# Patient Record
Sex: Female | Born: 1960 | Race: Asian | Hispanic: No | Marital: Married | Smoking: Never smoker
Health system: Southern US, Community
[De-identification: ages and names within clinical notes are randomized; demographics above are authoritative.]

## PROBLEM LIST (undated history)

## (undated) DIAGNOSIS — R112 Nausea with vomiting, unspecified: Secondary | ICD-10-CM

## (undated) DIAGNOSIS — E785 Hyperlipidemia, unspecified: Secondary | ICD-10-CM

## (undated) DIAGNOSIS — Z9889 Other specified postprocedural states: Secondary | ICD-10-CM

## (undated) HISTORY — DX: Hyperlipidemia, unspecified: E78.5

## (undated) HISTORY — PX: OTHER SURGICAL HISTORY: SHX169

---

## 1986-02-17 HISTORY — PX: APPENDECTOMY: SHX54

## 2020-05-30 ENCOUNTER — Other Ambulatory Visit (HOSPITAL_COMMUNITY)
Admission: RE | Admit: 2020-05-30 | Discharge: 2020-05-30 | Disposition: A | Discharge status: Home / Self Care | Payer: 59 | Source: Ambulatory Visit | Attending: Advanced Practice Midwife | Admitting: Advanced Practice Midwife

## 2020-05-30 ENCOUNTER — Ambulatory Visit (INDEPENDENT_AMBULATORY_CARE_PROVIDER_SITE_OTHER): Payer: 59 | Admitting: Advanced Practice Midwife

## 2020-05-30 ENCOUNTER — Other Ambulatory Visit: Payer: Self-pay

## 2020-05-30 ENCOUNTER — Encounter: Payer: Self-pay | Admitting: Advanced Practice Midwife

## 2020-05-30 VITALS — BP 120/72 | Ht 60.0 in | Wt 142.6 lb

## 2020-05-30 DIAGNOSIS — N950 Postmenopausal bleeding: Secondary | ICD-10-CM

## 2020-05-30 DIAGNOSIS — Z124 Encounter for screening for malignant neoplasm of cervix: Secondary | ICD-10-CM

## 2020-05-30 DIAGNOSIS — T839XXA Unspecified complication of genitourinary prosthetic device, implant and graft, initial encounter: Secondary | ICD-10-CM

## 2020-05-30 NOTE — Progress Notes (Signed)
Pt states she has IUD that has been in for years and she has not been able to have it removed. Because she states it could be stuck in there and cant get it out.

## 2020-06-01 LAB — CYTOLOGY - PAP
Comment: NEGATIVE
Diagnosis: UNDETERMINED — AB
High risk HPV: NEGATIVE

## 2020-06-05 NOTE — Progress Notes (Signed)
Patient ID: Dynesha Roth, female   DOB: [birth year], 60 y.o.   MRN: [id number]  Reason for Consult: Post-menopausal bleeding    Subjective:  HPI:  Lorraine Roth is a 60 y.o. female being seen for post-menopausal bleeding. She is accompanied by her daughter and the visit is facilitated by an interpreter. She has been post-menopausal since age 26 and last week she had 8 days of dark red/light flow bleeding. Last PAP smear was 4 or 5 years ago. She mentions having a Figure 8 IUD placed 30 years ago in [place]. She has been in the [place] for 1 year. She had imaging- unknown when or where- that showed the IUD had broken into pieces and would need to be surgically removed. She declined at the time. Recommended PAP smear today and follow up gyn ultrasound/MD appointment.  Update to visit: PAP smear is ASCUS/ negative HR HPV. Voicemail left on daughter's phone. Please see lab result note.  History reviewed. No pertinent past medical history. History reviewed. No pertinent family history. Past Surgical History:  Procedure Laterality Date  . APPENDECTOMY  1988    Short Social History:  Social History   Tobacco Use  . Smoking status: Never Smoker  . Smokeless tobacco: Never Used  Substance Use Topics  . Alcohol use: Never    No Known Allergies  No current outpatient medications on file.   No current facility-administered medications for this visit.   Review of Systems  Constitutional: Negative for chills and fever.  HENT: Negative for congestion, ear discharge, ear pain, hearing loss, sinus pain and sore throat.   Eyes: Negative for blurred vision and double vision.  Respiratory: Negative for cough, shortness of breath and wheezing.   Cardiovascular: Negative for chest pain, palpitations and leg swelling.  Gastrointestinal: Negative for abdominal pain, blood in stool, constipation, diarrhea, heartburn, melena, nausea and vomiting.  Genitourinary: Negative for dysuria, flank pain, frequency,  hematuria and urgency.       Positive for vaginal bleeding, IUD that has been in uterus for 30 years  Musculoskeletal: Negative for back pain, joint pain and myalgias.  Skin: Negative for itching and rash.  Neurological: Negative for dizziness, tingling, tremors, sensory change, speech change, focal weakness, seizures, loss of consciousness, weakness and headaches.  Endo/Heme/Allergies: Negative for environmental allergies. Does not bruise/bleed easily.  Psychiatric/Behavioral: Negative for depression, hallucinations, memory loss, substance abuse and suicidal ideas. The patient is not nervous/anxious and does not have insomnia.      Objective:  Objective   Vitals:   05/30/20 0825  BP: 120/72  Weight: 142 lb 9.6 oz (64.7 kg)  Height: 5' (1.524 m)   Body mass index is 27.85 kg/m. Constitutional: Well nourished, well developed female in no acute distress.  HEENT: normal Skin: Warm and dry.  Cardiovascular: Regular rate and rhythm.   Extremity: no edema  Respiratory: Clear to auscultation bilateral. Normal respiratory effort Abdomen: soft, nontender, nondistended, no abnormal masses, no epigastric pain Neuro: DTRs 2+, Cranial nerves grossly intact Psych: Alert and Oriented x3. No memory deficits. Normal mood and affect.  MS: normal gait, normal bilateral lower extremity ROM/strength/stability.  Pelvic exam: (female chaperone present) is not limited by body habitus EGBUS: within normal limits Vagina: within normal limits and with normal mucosa, no blood in the vault, no IUD strings visualized Cervix: normal appearance   Assessment/Plan:     60 y.o. G2 P85 female with post-menopausal bleeding, need for gyn ultrasound to evaluate 60 year old IUD  PAP smear  and follow up as needed Return for Gyn ultrasound Follow up visit with MD   Tresea Mall CNM [facility] 06/05/2020, 10:49

## 2020-07-24 ENCOUNTER — Ambulatory Visit
Admission: RE | Admit: 2020-07-24 | Discharge: 2020-07-24 | Disposition: A | Discharge status: Home / Self Care | Payer: 59 | Source: Ambulatory Visit | Attending: Advanced Practice Midwife | Admitting: Advanced Practice Midwife

## 2020-07-24 DIAGNOSIS — N950 Postmenopausal bleeding: Secondary | ICD-10-CM | POA: Diagnosis present

## 2020-07-24 DIAGNOSIS — T839XXA Unspecified complication of genitourinary prosthetic device, implant and graft, initial encounter: Secondary | ICD-10-CM | POA: Diagnosis present

## 2020-08-08 ENCOUNTER — Ambulatory Visit (INDEPENDENT_AMBULATORY_CARE_PROVIDER_SITE_OTHER): Payer: 59 | Admitting: Obstetrics and Gynecology

## 2020-08-08 ENCOUNTER — Ambulatory Visit: Payer: 59 | Admitting: Advanced Practice Midwife

## 2020-08-08 ENCOUNTER — Encounter: Payer: Self-pay | Admitting: Obstetrics and Gynecology

## 2020-08-08 VITALS — BP 110/70 | Ht 60.0 in | Wt 144.4 lb

## 2020-08-08 DIAGNOSIS — Z1231 Encounter for screening mammogram for malignant neoplasm of breast: Secondary | ICD-10-CM

## 2020-08-08 DIAGNOSIS — N950 Postmenopausal bleeding: Secondary | ICD-10-CM | POA: Diagnosis not present

## 2020-08-08 DIAGNOSIS — T8339XA Other mechanical complication of intrauterine contraceptive device, initial encounter: Secondary | ICD-10-CM | POA: Diagnosis not present

## 2020-08-08 NOTE — Progress Notes (Signed)
Patient ID: Lorraine Roth, female   DOB: [birth year], 60 y.o.   MRN: [id number]  Reason for Consult: Results   Referred by No ref. provider found  Subjective:     HPI:  [place] interpreter was used via the interpreter line for this encounter.  Lorraine Roth is a 61 y.o. female.  She is following up after consultation with Tresea Mall our nurse midwife.  Pelvic ultrasound showed a thickened endometrium as well as a retained IUD with in fragments.  Patient reports that her IUD was inserted in [place] 30 years ago and it is a figure-of-eight shape.  The patient is present at today's visit with her daughter and her daughter reports that her mother's vaginal bleeding has resolved.  Gynecological History  No LMP recorded. Patient is postmenopausal.  Last Pap: Results were: 2022 ASCUS HPV negative.   History reviewed. No pertinent past medical history. History reviewed. No pertinent family history. Past Surgical History:  Procedure Laterality Date   APPENDECTOMY  41    Short Social History:  Social History   Tobacco Use   Smoking status: Never   Smokeless tobacco: Never  Substance Use Topics   Alcohol use: Never    No Known Allergies  No current outpatient medications on file.   No current facility-administered medications for this visit.    Review of Systems  Constitutional: Negative for chills, fatigue, fever and unexpected weight change.  HENT: Negative for trouble swallowing.  Eyes: Negative for loss of vision.  Respiratory: Negative for cough, shortness of breath and wheezing.  Cardiovascular: Negative for chest pain, leg swelling, palpitations and syncope.  GI: Negative for abdominal pain, blood in stool, diarrhea, nausea and vomiting.  GU: Negative for difficulty urinating, dysuria, frequency and hematuria.  Musculoskeletal: Negative for back pain, leg pain and joint pain.  Skin: Negative for rash.  Neurological: Negative for dizziness, headaches,  light-headedness, numbness and seizures.  Psychiatric: Negative for behavioral problem, confusion, depressed mood and sleep disturbance.       Objective:  Objective   Vitals:   08/08/20 0851  BP: 110/70  Weight: 144 lb 6.4 oz (65.5 kg)  Height: 5' (1.524 m)   Body mass index is 28.2 kg/m.  Physical Exam Vitals and nursing note reviewed. Exam conducted with a chaperone present.  Constitutional:      Appearance: Normal appearance.  HENT:     Head: Normocephalic and atraumatic.  Eyes:     Extraocular Movements: Extraocular movements intact.     Pupils: Pupils are equal, round, and reactive to light.  Cardiovascular:     Rate and Rhythm: Normal rate and regular rhythm.  Pulmonary:     Effort: Pulmonary effort is normal.     Breath sounds: Normal breath sounds.  Abdominal:     General: Abdomen is flat.     Palpations: Abdomen is soft.  Musculoskeletal:     Cervical back: Normal range of motion.  Skin:    General: Skin is warm and dry.  Neurological:     General: No focal deficit present.     Mental Status: She is alert and oriented to person, place, and time.  Psychiatric:        Behavior: Behavior normal.        Thought Content: Thought content normal.        Judgment: Judgment normal.    Assessment/Plan:     60 year old with retained IUD and postmenopausal bleeding. We will plan hysteroscopy D&C for removal of IUD/fragments of IUD and  to sample the endometrium.  We discussed the risks of a hysteroscopy D&C including risk of infection risk of bleeding and risk of damage to surrounding pelvic organs.  All questions were answered. Note sent to surgical scheduler to schedule the procedure.  Breast cancer screening-mammogram ordered today discussed with patient.  More than 30 minutes were spent face to face with the patient in the room, reviewing the medical record, labs and images, and coordinating care for the patient. The plan of management was discussed in detail  and counseling was provided.   Adelene Idler MD [facility], [facility] 08/08/2020 9:54 AM

## 2020-08-08 NOTE — H&P (View-Only) (Signed)
 Patient ID: Lorraine Roth, female   DOB: 04/06/1960, 60 y.o.   MRN: [id number]  Reason for Consult: Results   Referred by No ref. provider found  Subjective:     HPI:  [place] interpreter was used via the interpreter line for this encounter.  Lorraine Roth is a 60 y.o. female.  She is following up after consultation with Jane Gledhill our nurse midwife.  Pelvic ultrasound showed a thickened endometrium as well as a retained IUD with in fragments.  Patient reports that her IUD was inserted in [place] 30 years ago and it is a figure-of-eight shape.  The patient is present at today's visit with her daughter and her daughter reports that her mother's vaginal bleeding has resolved.  Gynecological History  No LMP recorded. Patient is postmenopausal.  Last Pap: Results were: 2022 ASCUS HPV negative.   History reviewed. No pertinent past medical history. History reviewed. No pertinent family history. Past Surgical History:  Procedure Laterality Date   APPENDECTOMY  1988    Short Social History:  Social History   Tobacco Use   Smoking status: Never   Smokeless tobacco: Never  Substance Use Topics   Alcohol use: Never    No Known Allergies  No current outpatient medications on file.   No current facility-administered medications for this visit.    Review of Systems  Constitutional: Negative for chills, fatigue, fever and unexpected weight change.  HENT: Negative for trouble swallowing.  Eyes: Negative for loss of vision.  Respiratory: Negative for cough, shortness of breath and wheezing.  Cardiovascular: Negative for chest pain, leg swelling, palpitations and syncope.  GI: Negative for abdominal pain, blood in stool, diarrhea, nausea and vomiting.  GU: Negative for difficulty urinating, dysuria, frequency and hematuria.  Musculoskeletal: Negative for back pain, leg pain and joint pain.  Skin: Negative for rash.  Neurological: Negative for dizziness, headaches,  light-headedness, numbness and seizures.  Psychiatric: Negative for behavioral problem, confusion, depressed mood and sleep disturbance.       Objective:  Objective   Vitals:   08/08/20 0851  BP: 110/70  Weight: 144 lb 6.4 oz (65.5 kg)  Height: 5' (1.524 m)   Body mass index is 28.2 kg/m.  Physical Exam Vitals and nursing note reviewed. Exam conducted with a chaperone present.  Constitutional:      Appearance: Normal appearance.  HENT:     Head: Normocephalic and atraumatic.  Eyes:     Extraocular Movements: Extraocular movements intact.     Pupils: Pupils are equal, round, and reactive to light.  Cardiovascular:     Rate and Rhythm: Normal rate and regular rhythm.  Pulmonary:     Effort: Pulmonary effort is normal.     Breath sounds: Normal breath sounds.  Abdominal:     General: Abdomen is flat.     Palpations: Abdomen is soft.  Musculoskeletal:     Cervical back: Normal range of motion.  Skin:    General: Skin is warm and dry.  Neurological:     General: No focal deficit present.     Mental Status: She is alert and oriented to person, place, and time.  Psychiatric:        Behavior: Behavior normal.        Thought Content: Thought content normal.        Judgment: Judgment normal.    Assessment/Plan:     60-year-old with retained IUD and postmenopausal bleeding. We will plan hysteroscopy D&C for removal of IUD/fragments of IUD and   to sample the endometrium.  We discussed the risks of a hysteroscopy D&C including risk of infection risk of bleeding and risk of damage to surrounding pelvic organs.  All questions were answered. Note sent to surgical scheduler to schedule the procedure.  Breast cancer screening-mammogram ordered today discussed with patient.  More than 30 minutes were spent face to face with the patient in the room, reviewing the medical record, labs and images, and coordinating care for the patient. The plan of management was discussed in detail  and counseling was provided.   Adelene Idler MD [facility], [facility] 08/08/2020 9:54 AM

## 2020-08-08 NOTE — Patient Instructions (Signed)
Soi t? cung Hysteroscopy Soi t? cung l th? thu?t ???c s? d?ng ?? nhn vo bn trong d? con (t? cung) c?a ph? n?. Th? thu?t ny c th? ???c th?c hi?n v nhi?u l, bao g?m: ?? tm kh?i u v cc kh?i u khc trong t? cung. ?? ?nh gi ch?y mu b?t th??ng, kh?i u x?, polyp, m s?o ho?c ung th? t? cung. ?? xc ??nh nguyn nhn ph? n? khng th? mang thai ho?c b? s?y thai nhi?u l?n. ?? ??nh v? IUD (vng trnh [place] trong t? cung). ?? ??t d?ng c? trnh [place] vo ?ng d?n tr?ng. Trong qu trnh th?c hi?n th? thu?t ny, m?t ?ng m?nh, m?m c ?n nh? v camera (?ng soi t? cung) ???c s? d?ng ?? ki?m tra t? cung. Camera g?i hnh ?nh ??n mn hnh trong phng ?? chuyn gia ch?m [place] s?c kh?e c th? xem bn trong t? cung c?a qu v?.Ph?i th?c hi?n soi t? cung ngay sau k? kinh. Hy cho chuyn gia ch?m [place] s?c kh?e bi?t v?: B?t k? d? ?ng no m qu v? c. T?t c? cc lo?i thu?c m qu v? ?ang s? d?ng, bao g?m c? vitamin, th?o d??c, thu?c nh? m?t, thu?c d?ng kem v thu?c khng k ??n. B?t k? v?n ?? no m qu v? ho?c cc thnh vin trong gia ?nh ? b? v?i thu?c gy m. B?t k? b?nh l v? mu no m qu v? c. B?t k? ph?u thu?t no qu v? ? c. B?t k? tnh tr?ng b?nh l no qu v? c. Qu v? c ?ang mang thai ho?c c th? c thai hay khng. D qu v? ? t?ng c ch?n ?on l b? STI (b?nh ly truy?n qua ???ng tnh d?c) ho?c qu v? ngh? r?ng qu v? b? STI. C nh?ng nguy c? no? Nhn chung, ?y l m?t th? thu?t an ton. Tuy nhin, cc v?n ?? c th? x?y ra, bao g?m: Ch?y mu qu nhi?u. Nhi?m trng. T?n th??ng t? cung ho?c cc c?u trc ho?c c? quan khc. Ph?n ?ng d? ?ng v?i thu?c ho?c d?ch ???c s? d?ng trong th? thu?t. ?i?u g x?y ra tr??c khi lm th? thu?t? Hy ?? c? th? ?? n??c Tun th? ch? d?n c?a chuyn gia ch?m [place] s?c kh?e v? vi?c duy tr ?? n??c, c th? bao g?m: T?i ?a 2 ti?ng tr??c khi lm th? thu?t - qu v? c th? ti?p t?c u?ng ?? l?ng trong, ch?ng h?n nh? n??c, n??c p tri cy trong, c ph ?en v tr nguyn  ch?t. Nh?ng h?n ch? v? ?n v u?ng [place] th? ch? d?n c?a chuyn gia ch?m [place] s?c kh?e v? ?n v u?ng, c th? bao g?m: 8 gi? tr??c khi ti?n hnh th? thu?t - d?ng ?n th?c ph?m r?n v ch? u?ng ?? l?ng trong. 2 gi? tr??c khi ti?n hnh th? thu?t - d?ng u?ng ?? l?ng trong. Thu?c Hy h?i chuyn gia ch?m [place] s?c kh?e v? vi?c: Thay ??i ho?c d?ng s? d?ng cc lo?i thu?c th??ng xuyn dng c?a qu v?. ?i?u ny ??c bi?t quan tr?ng n?u qu v? ?ang dng thu?c ?i?u tr? ti?u ???ng ho?c thu?c lm long mu. ?ang dng cc lo?i thu?c nh? aspirin v ibuprofen. Nh?ng lo?i thu?c ny c th? lm long mu. Khng du?ng nh??ng loa?i thu?c ny tr? khi chuyn gia ch?m so?c s??c kho?e h??ng d?n qu v? dng. Dng cc thu?c khng k ??n, cc vitamin, th?o d??c v th?c ph?m ch?c n?ng. Thu?c c th? ???c ??  t vo c? t? cung c?a qu v? vo ngy tr??c khi lm th? thu?t. Thu?c ny lm cho c? t? cung m? ra (gin ra). L? m? l?n h?n gip ??a ?ng soi t? cung vo trong t? cung d? dng h?n trong qu trnh lm th? thu?t. H??ng d?n chung Hy h?i chuyn gia ch?m [place] s?c kh?e: S? th?c hi?n nh?ng b??c g ?? gip ng?n ng?a nhi?m trng. Nh?ng b??c ny c th? bao g?m: R?a da b?ng x bng di?t m?m b?nh. Dng thu?c khng sinh. Khng s? d?ng b?t k? s?n ph?m no c nicotine ho?c thu?c l trong t nh?t 4 tu?n tr??c khi lm th? thu?t. Nh?ng s?n ph?m ny bao g?m thu?c l d?ng ht, thu?c l d?ng nhai v d?ng c? ht thu?c, ch?ng h?n nh? thu?c l ?i?n t?. N?u qu v? c?n gip ?? ?? cai thu?c, hy h?i chuyn gia ch?m [place] s?c kh?e. C k? ho?ch nh? m?t ng??i l?n ch?u trch nhi?m ??a quy? vi? t? b?nh vi?n ho?c t? phng khm v? nh. C k? ho?ch nh? m?t ng??i l?n ch?u trch nhi?m ch?m [place] cho qu v? trong kho?ng th?i gian qu v? ???c ch? d?n sau khi qu v? r?i b?nh vi?n ho?c pho?ng kha?m. ?i?u ny c vai tr quan tr?ng. ?i ti?u tr??c khi b?t ??u lm th? thu?t. ?i?u g x?y ra trong qu trnh th?c hi?n th? thu?t? M?t ???ng truy?n t?nh m?ch (IV) s? ???c lu?n vo m?t  trong cc t?nh m?ch c?a qu v?. Qu v? c th? ???c cho dng: M?t lo?i thu?c ?? gip quy? vi? th? gin (thu?c an th?n). M?t lo?i thu?c ?? lm t vng xung quanh c? t? cung (gy t c?c b?). M?t lo?i thu?c lm qu v? ng? (thu?c gy m theo ???ng toa?n thn). ?ng soi t? cung s? ???c lu?n qua m ??o v vo t? cung c?a qu v?. Khng kh ho?c ch?t l?ng s? ???c s? d?ng nh?m m? r?ng t? cung c?a qu v? ?? cho php chuyn gia ch?m [place] s?c kh?e quan st t? cung r h?n. L??ng ch?t l?ng ???c s? d?ng s? ???c ki?m tra k? l??ng trong su?t qu trnh th?c hi?n th? thu?t. Trong m?t s? tr??ng h?p, ng??i ta c th? c?o nh? m t? bn trong t? cung v g?i ??n phng th nghi?m ?? xt nghi?m (sinh Braeleigh?t). Th? thu?t ny c th? khc nhau gi?a cc chuyn gia ch?m [place] s?c kh?e v ccb?nh vi?n. Ti c th? d? ki?n ?i?u g s? x?y ra sau khi lm th? thu?t? Huy?t p, nh?p tim, nh?p th? v n?ng ??  xi trong mu c?a qu v? s? ???c theo di cho ??n khi qu v? xu?t vi?n ho?c r?i phng khm. Qu v? c th? b? ?au qu?n. Qu v? c th? ???c cho dng thu?c ?i?u tr? v?n ?? ny. Qu v? c th? b? ch?y mu, t? ??m nh?t ??n ch?y mu nh? kinh nguy?t. Hi?n t??ng ny l bnh th??ng. N?u ???c sinh Dariana?t, qu v? ???c ty  l?y k?t qu?Marland Kitchen Hy h?i chuyn gia ch?m [place] s?c kh?e ho?c khoa th?c hi?n th? thu?t khi no c k?t qu? c?a qu v?. Tun th? nh?ng h??ng d?n ny ? nh: Ho?t ??ng Ngh? ng?i theo ch? d?n c?a chuyn gia ch?m [place] s?c kh?e. Tr? l?i sinh ho?t bnh th??ng theo ch? d?n c?a chuyn gia ch?m [place] s?c kh?e. Hy h?i chuyn gia ch?m  s?c kh?e v? cc ho?t ??ng no l an ton cho  qu v?. N?u qu v? ???c cho dng thu?c an th?n trong qu trnh th?c hi?n th? thu?t, thu?c c th? ?nh h??ng ??n qu v? trong vi gi?Imagene Sheller li xe ho?c v?n hnh my mc cho ??n khi chuyn gia ch?m [place] s?c kh?e c?a quy? vi? no?i vi?c ?o? la? an toa?n. Thu?c Khng dng aspirin ho?c cc thu?c ch?ng vim khng steroid (NSAID) khc trong khi h?i ph?c, theo ch? d?n c?a chuyn gia  ch?m [place] s?c kh?e. Thu?c c th? lm t?ng nguy c? ch?y mu. Hy h?i chuyn gia ch?m [place] s?c kh?e xem khi dng thu?c ???c k ??n cho qu v?: C c?n ph?i trnh li xe ho?c trnh s? d?ng my mc hay khng. C th? gy to bn hay khng. Qu v? c th? c?n th?c hi?n cc hnh ??ng ny ?? ng?n ng?a ho?c ?i?u tr? to bn: U?ng ?? n??c ?? gi? cho n??c ti?u c mu vng nh?t. Dng thu?c khng k ??n ho?c thu?c k ??n. ?n th?c ?n giu ch?t x? nh? ??u, ng? c?c nguyn cm, tri cy t??i v rau. H?n ch? cc lo?i th?c ?n giu ch?t bo v ???ng tinh luy?n, ch?ng h?n nh? ?? ?n chin/rn ho?c ?? ng?t. H??ng d?n chung Khng th?t r?a, s?? du?ng b?ng v? sinh d?ng nt, ho?c quan h? tnh d?c trong 2 tu?n sau khi lm th? thu?t, ho?c cho ??n khi chuyn gia ch?m so?c s??c kho?e ch?p thu?n. Khng t?m b?n, b?i, ho?c s? d?ng b?n t?m n??c nng cho ??n khi ???c chuyn gia ch?m [place] s?c kh?e c?a qu v? ch?p thu?n. T?m vi hoa sen thay v t?m b?n trong 2 tu?n, ho?c trong kho?ng th?i gian theo ch? d?n c?a chuyn gia ch?m [place] s?c kh?e. Tun th? theo t?t c? cc l?n khm l?i. ?i?u ny c vai tr quan tr?ng. Hy lin l?c v?i chuyn gia ch?m [place] s?c kh?e n?u: Qu v? c?m th?y chng m?t ho?c chong vng. Qu v? c?m th?y bu?n nn. Qu v? ra kh h? ? m ??o b?t th??ng. Qu v? b? pht ban. Quy? vi? bi? ?au khng ??? sau khi du?ng thu?c. Qu v? b? ?n l?nh. Yu c?u tr? gip ngay l?p t?c n?u: Qu v? b? ch?y mu n?ng h?n k? kinh thng th??ng. Qu v? b? s?t. Qu v? b? ?au ho?c ?au qu?n tr?m tr?ng h?n. Qu v? b? c?n ?au b?ng m?i. Qu v? b? ng?t. Qu v? b? ?au ? vai. Quy? vi? bi? kh th?. Tm t?t Soi t? cung l th? thu?t ???c s? d?ng ?? nhn vo bn trong d? con (t? cung) c?a ph? n?. Sau khi lm th? thu?t, qu v? c th? b? ch?y mu, t? ??m nh?t ??n ch?y mu gi?ng nh? kinh nguy?t. Hi?n t??ng ny l bnh th??ng. Qu v? c?ng c th? b? ?au qu?n. Khng th?t r?a, s?? du?ng b?ng v? sinh d?ng nt, ho?c quan h? tnh d?c trong 2 tu?n sau khi lm  th? thu?t, ho?c cho ??n khi chuyn gia ch?m so?c s??c kho?e ch?p thu?n. C k? ho?ch nh? m?t ng??i l?n ch?u trch nhi?m ??a quy? vi? t? b?nh vi?n ho?c t? phng khm v? nh. Thng tin ny khng nh?m m?c ?ch thay th? cho l?i khuyn m chuyn gia ch?m [place] s?c kh?e ni v?i qu v?. Hy b?o ??m qu v? ph?i th?o lu?n b?t k? v?n ?? gm qu v? c v?i chuyn gia ch?m [place] s?c kh?e c?a qu v?. Document Revised: 11/25/2019 Document Reviewed: 11/25/2019 Elsevier  Elsevier Patient Education  2022 Elsevier Inc.  

## 2020-08-13 ENCOUNTER — Telehealth: Payer: Self-pay

## 2020-08-13 NOTE — Telephone Encounter (Signed)
-----   Message from Christanna R Schuman, MD sent at 08/08/2020  9:50 AM EDT ----- Regarding: surgery Surgery Booking Request Patient Full Name:  Kamaria Edman  MRN: [id number]  DOB: 10/29/1960  Surgeon: Christanna R Schuman, MD  Requested Surgery Date and Time: next 1-2 weeks Primary Diagnosis AND Code: Postmenopausal bleeding, retained IUD Secondary Diagnosis and Code:  Surgical Procedure: Hysteroscopy D&C, IUD removal RNFA Requested?: No L&D Notification: No Admission Status: same day surgery Length of Surgery: 50 min Special Case Needs: No H&P: No Phone Interview???:  Yes Interpreter: Yes Medical Clearance:  No Special Scheduling Instructions: No Any known health/anesthesia issues, diabetes, sleep apnea, latex allergy, defibrillator/pacemaker?: No Acuity: P2   (P1 highest, P2 delay may cause harm, P3 low, elective gyn, P4 lowest)   

## 2020-08-14 ENCOUNTER — Telehealth: Payer: Self-pay

## 2020-08-14 ENCOUNTER — Other Ambulatory Visit: Payer: Self-pay

## 2020-08-14 ENCOUNTER — Ambulatory Visit
Admission: RE | Admit: 2020-08-14 | Discharge: 2020-08-14 | Disposition: A | Discharge status: Home / Self Care | Payer: 59 | Source: Ambulatory Visit | Attending: Obstetrics and Gynecology | Admitting: Obstetrics and Gynecology

## 2020-08-14 DIAGNOSIS — Z1231 Encounter for screening mammogram for malignant neoplasm of breast: Secondary | ICD-10-CM | POA: Diagnosis present

## 2020-08-14 NOTE — Telephone Encounter (Signed)
Called patient's daughter (on [place]) to schedule Hysteroscopy D&C, IUD removal w Schuman  DOS 7/7  H&P  n/a   Pre-admit phone call appointment to be requested. All appointments will be updated on pt MyChart. Explained that this appointment has a call window. Based on the time scheduled will indicate if the call will be received within a 4 hour window before 1:00 or after.  Advised that pt may also receive calls from the hospital pharmacy and pre-service center.  Confirmed pt has [employer] as primary insurance. No secondary insurance.

## 2020-08-14 NOTE — Telephone Encounter (Signed)
-----   Message from Natale Milch, MD sent at 08/08/2020  9:50 AM EDT ----- Regarding: surgery Surgery Booking Request Patient Full Name:  Lorraine Roth  MRN: [id number]  DOB: 11/25/1960  Surgeon: Natale Milch, MD  Requested Surgery Date and Time: next 1-2 weeks Primary Diagnosis AND Code: Postmenopausal bleeding, retained IUD Secondary Diagnosis and Code:  Surgical Procedure: Hysteroscopy D&C, IUD removal RNFA Requested?: No L&D Notification: No Admission Status: same day surgery Length of Surgery: 50 min Special Case Needs: No H&P: No Phone Interview???:  Yes Interpreter: Yes Medical Clearance:  No Special Scheduling Instructions: No Any known health/anesthesia issues, diabetes, sleep apnea, latex allergy, defibrillator/pacemaker?: No Acuity: P2   (P1 highest, P2 delay may cause harm, P3 low, elective gyn, P4 lowest)

## 2020-08-16 ENCOUNTER — Other Ambulatory Visit
Admission: RE | Admit: 2020-08-16 | Discharge: 2020-08-16 | Disposition: A | Discharge status: Home / Self Care | Payer: 59 | Source: Ambulatory Visit | Attending: Obstetrics and Gynecology | Admitting: Obstetrics and Gynecology

## 2020-08-16 ENCOUNTER — Other Ambulatory Visit: Payer: Self-pay

## 2020-08-16 NOTE — Patient Instructions (Addendum)
Th? t?c c?a b?n ???c ln l?ch vo: 08/23/20 - Th? N?m Bo cho Qu?y ??ng k t?i t?ng 1 Trung tm Y t?. ?? bi?t th?i gian ??n c?a b?n, vui lng g?i [phone] [id number] t? 1PM - 3PM vo: 08/22/20 [facility] 08/21/20 lc 10:30 cho phng th nghi?m v ti.  NH?: Cc h??ng d?n khng ???c tun th? hon ton c th? d?n ??n r?i ro y t? nghim tr?ng, c th? d?n ??n t? vong; ho?c ty theo quy?t ??nh c?a bc s? ph?u thu?t v bc s? gy m, ph?u thu?t c?a b?n c th? c?n ???c ln l?ch l?i.  Khng ?n th?c ?n sau n?a ?m vo ?m tr??c khi ph?u thu?t. Khng nhai k?o cao su, k?o ng?m ho?c k?o c?ng.  Tuy nhin, b?n c th? u?ng ch?t l?ng CLEAR t?i ?a 2 gi? tr??c khi d? ki?n ??n ph?u thu?t. Khng u?ng b?t c? th? g trong vng 2 gi? k? t? th?i gian ??n d? ki?n c?a b?n.  Ch?t l?ng trong bao g?m: - n??c - n??c p to khng c b - gatorade (khng ph?i ??, TM, HO?C XANH) - c ph ?en ho?c tr (KHNG thm s?a ho?c kem t??i vo c ph ho?c tr) K[facility]NG u?ng b?t c? th? g khng c trong danh sch ny  B?nh nhn ti?u ???ng lo?i 1 v lo?i 2 ch? nn u?ng n??c.  Ngoi ra, bc s? ? ch? ??nh cho b?n u?ng lo?i thu?c ???c cung c?p ??m b?o Th?c u?ng c Carbohydrate r rng tr??c khi ph?u thu?t U?ng th?c u?ng ch?a carbohydrate ny tr??c khi ph?u thu?t hai gi? gip gi?m tnh tr?ng khng insulin v c?i [place]?n k?t qu? c?a b?nh nhn. Vui lng hon thnh vi?c u?ng tr??c 2 gi? so v?i th?i gian ??n d? ki?n.  HY DNG NH?NG THU?C [place] SAU KHI PH?U THU?T B?NG SIP N??C: KHNG  M?t tu?n tr??c khi ph?u thu?t: Ng?ng thu?c ch?ng vim (NSAIDS) nh? Advil, Aleve, Ibuprofen, Motrin, Naproxen, Naprosyn v cc s?n ph?m d?a trn Aspirin nh? Excedrin, Goodys Powder, BC Powder.  Ng?ng b?t ??u t? 30/06/22 B?T K? CH?T b? sung NO TRN ??M cho ??n sau khi ph?u thu?t.  B?n c th? dng Tylenol n?u c?n ?? gi?m ?au cho ??n ngy ph?u thu?t.  Khng u?ng r??u trong 24 gi? tr??c ho?c sau khi ph?u thu?t.  Khng ht thu?c bao g?m c? thu?c l  ?i?n t? trong 24 gi? tr??c khi ph?u thu?t. Khng nhai cc s?n ph?m thu?c l t nh?t 6 gi? tr??c khi ph?u thu?t. Khng c mi?ng dn nicotine vo ngy ph?u thu?t.  Khng s? d?ng b?t k? lo?i thu?c "gi?i tr" no t nh?t m?t tu?n tr??c khi ph?u thu?t. Xin l?u  r?ng s? k?t h?p gi?a cocaine v gy m c th? c k?t qu? tiu c?c, c th? d?n ??n t? vong. N?u b?n xt nghi?m d??ng tnh v?i cocaine, cu?c ph?u thu?t c?a b?n s? b? h?y b?Marland Kitchen Vo bu?i sng ph?u thu?t, ?nh r?ng b?ng kem ?nh r?ng v n??c, b?n c th? [place] mi?ng b?ng n??c [place] mi?ng n?u mu?n. Khng nu?t b?t k? lo?i kem ?nh r?ng ho?c n??c [place] mi?ng no.  Khng ?eo ?? trang s?c, ?? trang ?i?m, k?p tc, k?p ho?c s?n mng tay.  Khng thoa kem d??ng da, ph?n ph? ho?c n??c hoa.  Khng c?o c? th? t? c? tr? xu?ng 48 gi? tr??c khi ph?u thu?t ?? phng b?n t? c?t v c th? ?? l?i v?t th??ng nhi?m  trng. Ngoi ra, da m?i c?o c th? b? kch ?ng n?u s? d?ng x phng CHG.  Khng ???c ?eo knh p trng, my tr? thnh v r?ng gi? khi ph?u thu?t.  Khng mang ?? ??c c gi tr? ??n b?nh vi?n. [place] khng ch?u trch nhi?m cho b?t k? ?? ??c ho?c v?t c gi tr? no b? th?t l?c / m?t mt.  Thng bo cho bc s? c?a b?n n?u c b?t k? thay ??i no v? tnh tr?ng b?nh c?a b?n (c?m l?nh, s?t, nhi?m trng).  M?c qu?n o tho?i mi (dnh ring cho lo?i ph?u thu?t c?a b?n) ??n b?nh vi?n.  Sau khi ph?u thu?t, b?n c th? gip ng?n ng?a cc bi?n ch?ng ph?i b?ng cch th?c hi?n cc bi t?p th?. Ht th? su v ho sau m?i 1-2 gi?. Bc s? c?a b?n c th? yu c?u m?t [place]?t b? g?i l My ?o xo?n ?c khuy?n khch ?? gip b?n ht th? su. Khi ho ho?c h?t h?i, dng hai tay gi? ch?t m?t chi?c g?i d?a vo v?t m?. ?y ???c g?i l "n?p". Lm ?i?u ny gip b?o v? v?t m? c?a b?n. N c?ng lm gi?m s? kh ch?u ? b?ng.  IN?u b?n nh?p vi?n qua ?m, hy ?? vali trn xe. Sau khi ph?u thu?t, n c th? ???c mang ??n phng c?a b?n.  N?u b?n ???c xu?t vi?n vo ngy ph?u thu?t, b?n s? khng ???c  php li xe v? nh. B?n s? c?n m?t ng??i l?n c trch nhi?m (18 tu?i tr? ln) ch? b?n v? nh v ? l?i v?i b?n ?m ?Marland Kitchen  N?u b?n s? d?ng ph??ng ti?n giao thng cng c?ng, b?n s? c?n ph?i c ng??i l?n c trch nhi?m (18 tu?i tr? ln) ?i cng. Vui lng xc nh?n v?i bc s? c?a b?n r?ng b?n c th? s? d?ng ph??ng ti?n giao thng cng c?ng.  Vui lng g?i cho Phng Ki?m tra Tr??c khi nh?p h?c theo s? [phone] n?u b?n c b?t k? cu h?i no v? nh?ng h??ng d?n ny.  Chnh sch th?m khm ph?u thu?t:  B?nh nhn ?ang tr?i qua m?t cu?c ph?u thu?t ho?c th? thu?t c th? c m?t thnh vin trong gia ?nh ho?c ng??i h? tr? ?i cng v?i h? mi?n l ng??i ? khng d??ng tnh v?i COVID-19 ho?c ?ang g?p cc tri?u ch?ng c?a n. Ng??i ? c th? ? l?i khu v?c ch? ??i trong qu trnh lm th? t?c.  Th?m khm b?nh nhn n?i tr:  Gi? th?m quan l 7 gi? sng ??n 8 gi? t?i. B?nh nhn n?i tr s? ???c php hai ng??i th?m vi?ng m?i ngy. Nh?ng ng??i ??n th?m c th? thay ??i m?i ngy trong th?i gian b?nh nhn l?u tr. Khng c du khch d??i 12 tu?i. M?i du khch d??i 18 tu?i ph?i c ng??i l?n ?i cng. Ng??i ??n th?m ph?i v??t qua cc cu?c ki?m tra COVID-19, s? d?ng n??c r?a tay khi vo v ra kh?i phng c?a b?nh nhn v lun ?eo kh?u trang, k? c? khi ? trong phng c?a b?nh nhn. B?nh nhn c?ng ph?i ?eo kh?u trang khi nhn vin ho?c khch c?a h? ? trong phng. C?n ph?i ?eo kh?u trang b?t k? tnh tr?ng tim ch?ng.

## 2020-08-21 ENCOUNTER — Encounter: Payer: Self-pay | Admitting: Urgent Care

## 2020-08-21 ENCOUNTER — Other Ambulatory Visit
Admission: RE | Admit: 2020-08-21 | Discharge: 2020-08-21 | Disposition: A | Discharge status: Home / Self Care | Payer: 59 | Source: Ambulatory Visit | Attending: Obstetrics and Gynecology | Admitting: Obstetrics and Gynecology

## 2020-08-21 ENCOUNTER — Other Ambulatory Visit: Payer: Self-pay

## 2020-08-21 DIAGNOSIS — Z01812 Encounter for preprocedural laboratory examination: Secondary | ICD-10-CM | POA: Insufficient documentation

## 2020-08-21 LAB — CBC
HCT: 40.8 % (ref 36.0–46.0)
Hemoglobin: 13.3 g/dL (ref 12.0–15.0)
MCH: 28.8 pg (ref 26.0–34.0)
MCHC: 32.6 g/dL (ref 30.0–36.0)
MCV: 88.3 fL (ref 80.0–100.0)
Platelets: 227 10*3/uL (ref 150–400)
RBC: 4.62 MIL/uL (ref 3.87–5.11)
RDW: 11.8 % (ref 11.5–15.5)
WBC: 5.9 10*3/uL (ref 4.0–10.5)
nRBC: 0 % (ref 0.0–0.2)

## 2020-08-23 ENCOUNTER — Ambulatory Visit: Payer: 59 | Admitting: Anesthesiology

## 2020-08-23 ENCOUNTER — Encounter: Payer: Self-pay | Admitting: Obstetrics and Gynecology

## 2020-08-23 ENCOUNTER — Ambulatory Visit
Admission: RE | Admit: 2020-08-23 | Discharge: 2020-08-23 | Disposition: A | Discharge status: Home / Self Care | Payer: 59 | Attending: Obstetrics and Gynecology | Admitting: Obstetrics and Gynecology

## 2020-08-23 ENCOUNTER — Other Ambulatory Visit: Payer: Self-pay

## 2020-08-23 ENCOUNTER — Encounter
Admission: RE | Disposition: A | Discharge status: Home / Self Care | Payer: Self-pay | Source: Home / Self Care | Attending: Obstetrics and Gynecology

## 2020-08-23 DIAGNOSIS — N950 Postmenopausal bleeding: Secondary | ICD-10-CM | POA: Diagnosis not present

## 2020-08-23 DIAGNOSIS — N711 Chronic inflammatory disease of uterus: Secondary | ICD-10-CM | POA: Diagnosis not present

## 2020-08-23 DIAGNOSIS — Z0000 Encounter for general adult medical examination without abnormal findings: Secondary | ICD-10-CM

## 2020-08-23 DIAGNOSIS — Z30432 Encounter for removal of intrauterine contraceptive device: Secondary | ICD-10-CM | POA: Insufficient documentation

## 2020-08-23 DIAGNOSIS — T8339XA Other mechanical complication of intrauterine contraceptive device, initial encounter: Secondary | ICD-10-CM | POA: Diagnosis not present

## 2020-08-23 HISTORY — DX: Nausea with vomiting, unspecified: R11.2

## 2020-08-23 HISTORY — DX: Other specified postprocedural states: Z98.890

## 2020-08-23 HISTORY — PX: IUD REMOVAL: SHX5392

## 2020-08-23 HISTORY — PX: HYSTEROSCOPY WITH D & C: SHX1775

## 2020-08-23 LAB — POCT PREGNANCY, URINE: Preg Test, Ur: NEGATIVE

## 2020-08-23 LAB — ABO/RH: ABO/RH(D): A POS

## 2020-08-23 SURGERY — DILATATION AND CURETTAGE /HYSTEROSCOPY
Anesthesia: General

## 2020-08-23 MED ORDER — DEXAMETHASONE SODIUM PHOSPHATE 10 MG/ML IJ SOLN
INTRAMUSCULAR | Status: DC | PRN
  Administered 2020-08-23: 10 mg via INTRAVENOUS

## 2020-08-23 MED ORDER — FAMOTIDINE 20 MG PO TABS
ORAL_TABLET | ORAL | Status: AC
  Administered 2020-08-23: 20 mg via ORAL
  Filled 2020-08-23: qty 1

## 2020-08-23 MED ORDER — DEXAMETHASONE SODIUM PHOSPHATE 10 MG/ML IJ SOLN
INTRAMUSCULAR | Status: AC
  Filled 2020-08-23: qty 1

## 2020-08-23 MED ORDER — MIDAZOLAM HCL 2 MG/2ML IJ SOLN
INTRAMUSCULAR | Status: DC | PRN
  Administered 2020-08-23: 2 mg via INTRAVENOUS

## 2020-08-23 MED ORDER — ONDANSETRON HCL 4 MG/2ML IJ SOLN
INTRAMUSCULAR | Status: DC | PRN
  Administered 2020-08-23: 4 mg via INTRAVENOUS

## 2020-08-23 MED ORDER — 0.9 % SODIUM CHLORIDE (POUR BTL) OPTIME
TOPICAL | Status: DC | PRN
  Administered 2020-08-23: 500 mL

## 2020-08-23 MED ORDER — PROMETHAZINE HCL 25 MG/ML IJ SOLN: 6.2500 mg | INTRAMUSCULAR | Status: DC | PRN

## 2020-08-23 MED ORDER — PROPOFOL 10 MG/ML IV BOLUS
INTRAVENOUS | Status: AC
  Filled 2020-08-23: qty 20

## 2020-08-23 MED ORDER — LIDOCAINE HCL (CARDIAC) PF 100 MG/5ML IV SOSY
PREFILLED_SYRINGE | INTRAVENOUS | Status: DC | PRN
  Administered 2020-08-23: 80 mg via INTRAVENOUS

## 2020-08-23 MED ORDER — FENTANYL CITRATE (PF) 100 MCG/2ML IJ SOLN
INTRAMUSCULAR | Status: AC
  Filled 2020-08-23: qty 2

## 2020-08-23 MED ORDER — IBUPROFEN 600 MG PO TABS: 600.0000 mg | ORAL_TABLET | Freq: Four times a day (QID) | ORAL | 0 refills | Status: DC | PRN

## 2020-08-23 MED ORDER — PROPOFOL 10 MG/ML IV BOLUS
INTRAVENOUS | Status: DC | PRN
  Administered 2020-08-23: 30 mg via INTRAVENOUS
  Administered 2020-08-23: 120 mg via INTRAVENOUS
  Administered 2020-08-23: 20 mg via INTRAVENOUS

## 2020-08-23 MED ORDER — MIDAZOLAM HCL 2 MG/2ML IJ SOLN
INTRAMUSCULAR | Status: AC
  Filled 2020-08-23: qty 2

## 2020-08-23 MED ORDER — ONDANSETRON HCL 4 MG/2ML IJ SOLN
INTRAMUSCULAR | Status: AC
  Filled 2020-08-23: qty 2

## 2020-08-23 MED ORDER — APREPITANT 40 MG PO CAPS: 40.0000 mg | ORAL_CAPSULE | Freq: Once | ORAL | Status: AC

## 2020-08-23 MED ORDER — CHLORHEXIDINE GLUCONATE 0.12 % MT SOLN
OROMUCOSAL | Status: AC
  Administered 2020-08-23: 15 mL via OROMUCOSAL
  Filled 2020-08-23: qty 15

## 2020-08-23 MED ORDER — APREPITANT 40 MG PO CAPS
ORAL_CAPSULE | ORAL | Status: AC
  Administered 2020-08-23: 40 mg via ORAL
  Filled 2020-08-23: qty 1

## 2020-08-23 MED ORDER — CHLORHEXIDINE GLUCONATE 0.12 % MT SOLN: 15.0000 mL | Freq: Once | OROMUCOSAL | Status: AC

## 2020-08-23 MED ORDER — FAMOTIDINE 20 MG PO TABS: 20.0000 mg | ORAL_TABLET | Freq: Once | ORAL | Status: AC

## 2020-08-23 MED ORDER — FENTANYL CITRATE (PF) 100 MCG/2ML IJ SOLN
INTRAMUSCULAR | Status: DC | PRN
  Administered 2020-08-23 (×2): 25 ug via INTRAVENOUS

## 2020-08-23 MED ORDER — FENTANYL CITRATE (PF) 100 MCG/2ML IJ SOLN: 25.0000 ug | INTRAMUSCULAR | Status: DC | PRN

## 2020-08-23 MED ORDER — ORAL CARE MOUTH RINSE
15.0000 mL | Freq: Once | OROMUCOSAL | Status: AC
Start: 2020-08-23 — End: 2020-08-23

## 2020-08-23 MED ORDER — LACTATED RINGERS IV SOLN: INTRAVENOUS | Status: DC

## 2020-08-23 MED ORDER — POVIDONE-IODINE 10 % EX SWAB
2.0000 "application " | Freq: Once | CUTANEOUS | Status: AC
  Administered 2020-08-23: 2 via TOPICAL

## 2020-08-23 MED ORDER — SILVER NITRATE-POT NITRATE 75-25 % EX MISC
CUTANEOUS | Status: AC
  Filled 2020-08-23: qty 10

## 2020-08-23 SURGICAL SUPPLY — 24 items
BACTOSHIELD CHG 4% 4OZ (MISCELLANEOUS) ×1
CATH ROBINSON RED A/P 16FR (CATHETERS) ×2 IMPLANT
CNTNR SPEC 2.5X3XGRAD LEK (MISCELLANEOUS) ×1
CONT SPEC 4OZ STER OR WHT (MISCELLANEOUS) ×1
CONT SPEC 4OZ STRL OR WHT (MISCELLANEOUS) ×1
CONTAINER SPEC 2.5X3XGRAD LEK (MISCELLANEOUS) ×1 IMPLANT
DEVICE MYOSURE LITE (MISCELLANEOUS) IMPLANT
DEVICE MYOSURE REACH (MISCELLANEOUS) IMPLANT
ELECT REM PT RETURN 9FT ADLT (ELECTROSURGICAL)
ELECTRODE REM PT RTRN 9FT ADLT (ELECTROSURGICAL) IMPLANT
GAUZE 4X4 16PLY ~~LOC~~+RFID DBL (SPONGE) ×4 IMPLANT
GLOVE SURG SYN 6.5 ES PF (GLOVE) ×2 IMPLANT
GLOVE SURG UNDER POLY LF SZ6.5 (GLOVE) ×4 IMPLANT
GOWN STRL REUS W/ TWL LRG LVL3 (GOWN DISPOSABLE) ×2 IMPLANT
GOWN STRL REUS W/TWL LRG LVL3 (GOWN DISPOSABLE) ×4
IV NS IRRIG 3000ML ARTHROMATIC (IV SOLUTION) ×2 IMPLANT
KIT PROCEDURE FLUENT (KITS) ×2 IMPLANT
MANIFOLD NEPTUNE II (INSTRUMENTS) ×2 IMPLANT
PACK DNC HYST (MISCELLANEOUS) ×2 IMPLANT
PAD OB MATERNITY 4.3X12.25 (PERSONAL CARE ITEMS) ×2 IMPLANT
PAD PREP 24X41 OB/GYN DISP (PERSONAL CARE ITEMS) ×2 IMPLANT
SCRUB CHG 4% DYNA-HEX 4OZ (MISCELLANEOUS) ×1 IMPLANT
SEAL ROD LENS SCOPE MYOSURE (ABLATOR) ×2 IMPLANT
TOWEL OR 17X26 4PK STRL BLUE (TOWEL DISPOSABLE) ×2 IMPLANT

## 2020-08-23 NOTE — Anesthesia Procedure Notes (Signed)
Procedure Name: LMA Insertion Date/Time: 08/23/2020 3:42 PM Performed by: Irving Burton, CRNA Pre-anesthesia Checklist: Patient identified, Patient being monitored, Timeout performed, Emergency Drugs available and Suction available Patient Re-evaluated:Patient Re-evaluated prior to induction Oxygen Delivery Method: Circle system utilized Preoxygenation: Pre-oxygenation with 100% oxygen Induction Type: IV induction Ventilation: Mask ventilation without difficulty LMA: LMA inserted LMA Size: 3.5 Tube type: Oral Number of attempts: 1 Placement Confirmation: positive ETCO2 and breath sounds checked- equal and bilateral Tube secured with: Tape Dental Injury: Teeth and Oropharynx as per pre-operative assessment

## 2020-08-23 NOTE — Op Note (Signed)
Operative Note  08/23/2020  PRE-OP DIAGNOSIS: Postmenopausal bleeding.   POST-OP DIAGNOSIS: same   SURGEON: Makyra Corprew MD  PROCEDURE: Procedure(s): DILATATION AND CURETTAGE /HYSTEROSCOPY INTRAUTERINE DEVICE (IUD) REMOVAL   ANESTHESIA: Choice   ESTIMATED BLOOD LOSS: 5 cc   SPECIMENS:  IUD  FLUID DEFICIT: minimal  COMPLICATIONS: none  DISPOSITION: PACU - hemodynamically stable.  CONDITION: stable  FINDINGS: Exam under anesthesia revealed a normal uterus with bilateral adnexa without masses or fullness. Hysteroscopy revealed IUD inside the uterine cavity with bilateral tubal ostia and normal appearing endocervical canal.  PROCEDURE IN DETAIL: After informed consent was obtained, the patient was taken to the operating room where anesthesia was obtained without difficulty. The patient was positioned in the dorsal lithotomy position in [place] stirrups. The patient's bladder was catheterized with an in and out foley catheter. The patient was examined under anesthesia, with the above noted findings. The weighted speculum was placed inside the patient's vagina, and the the anterior lip of the cervix was seen and grasped with the tenaculum.  The uterine cavity was sounded to 6 cm, and then the cervix was progressively dilated to a 16 French-Pratt dilator. The 0 degree hysteroscope was introduced, with saline fluid used to distend the intrauterine cavity, The lower portion of the IUD was seen. The hysteroscope was removed. IUD was grasped with a [place] and removed intact.   The 0 degree hysteroscope was introduced, with saline fluid used to distend the intrauterine cavity, with the above noted findings. The hysteroscope was removed. The uterine cavity was curetted until a gritty texture was noted, yielding endometrial curettings. All instruments were removed, with excellent hemostasis noted throughout. She was then taken out of dorsal lithotomy. Minimal discrepancy in fluid was  noted.  The patient tolerated the procedure well. Sponge, lap and needle counts were correct x2. The patient was taken to recovery room in excellent condition.  Adelene Idler MD [facility], [facility] 08/23/2020 4:39 PM

## 2020-08-23 NOTE — Progress Notes (Signed)
Interpreter services used to give report to [facility], RN from OR.   Reference number: [id number]

## 2020-08-23 NOTE — Anesthesia Postprocedure Evaluation (Signed)
Anesthesia Post Note  Patient: Lorraine Roth  Procedure(s) Performed: DILATATION AND CURETTAGE /HYSTEROSCOPY INTRAUTERINE DEVICE (IUD) REMOVAL  Patient location during evaluation: PACU Anesthesia Type: General Level of consciousness: awake and alert Pain management: pain level controlled Vital Signs Assessment: post-procedure vital signs reviewed and stable Respiratory status: spontaneous breathing, nonlabored ventilation, respiratory function stable and patient connected to nasal cannula oxygen Cardiovascular status: blood pressure returned to baseline and stable Postop Assessment: no apparent nausea or vomiting Anesthetic complications: no   No notable events documented.   Last Vitals:  Vitals:   08/23/20 1715 08/23/20 1725  BP: 127/79 (!) 135/57  Pulse: 100 86  Resp: 15 14  Temp: 36.6 C (!) 36.4 C  SpO2: 100% 99%    Last Pain:  Vitals:   08/23/20 1725  TempSrc: Temporal  PainSc:                  Lenard Simmer

## 2020-08-23 NOTE — Transfer of Care (Signed)
Immediate Anesthesia Transfer of Care Note  Patient: Lorraine Roth  Procedure(s) Performed: DILATATION AND CURETTAGE /HYSTEROSCOPY INTRAUTERINE DEVICE (IUD) REMOVAL  Patient Location: PACU  Anesthesia Type:General  Level of Consciousness: sedated  Airway & Oxygen Therapy: Patient Spontanous Breathing and Patient connected to face mask oxygen  Post-op Assessment: Report given to RN and Post -op Vital signs reviewed and stable  Post vital signs: Reviewed and stable  Last Vitals:  Vitals Value Taken Time  BP 108/62 08/23/20 1620  Temp 36.2 C 08/23/20 1620  Pulse 80 08/23/20 1623  Resp 13 08/23/20 1623  SpO2 100 % 08/23/20 1623    Last Pain:  Vitals:   08/23/20 1620  TempSrc:   PainSc: 10-Worst pain ever         Complications: No notable events documented.

## 2020-08-23 NOTE — Interval H&P Note (Signed)
History and Physical Interval Note:  08/23/2020 3:21 PM  Lorraine Roth  has presented today for surgery, with the diagnosis of Postmenopausal bleeding, retained IUD.  The various methods of treatment have been discussed with the patient and family. After consideration of risks, benefits and other options for treatment, the patient has consented to  Procedure(s): DILATATION AND CURETTAGE /HYSTEROSCOPY (N/A) INTRAUTERINE DEVICE (IUD) REMOVAL (N/A) as a surgical intervention.  The patient's history has been reviewed, patient examined, no change in status, stable for surgery.  I have reviewed the patient's chart and labs.  Questions were answered to the patient's satisfaction.     Lorraine Roth

## 2020-08-23 NOTE — Anesthesia Preprocedure Evaluation (Signed)
Anesthesia Evaluation  Patient identified by MRN, date of birth, ID band Patient awake    Reviewed: Allergy & Precautions, H&P , NPO status , Patient's Chart, lab work & pertinent test results, reviewed documented beta blocker date and time   History of Anesthesia Complications (+) PONV and history of anesthetic complications  Airway Mallampati: I  TM Distance: >3 FB Neck ROM: full    Dental  (+) Dental Advidsory Given, Missing, Teeth Intact   Pulmonary neg pulmonary ROS,    Pulmonary exam normal breath sounds clear to auscultation       Cardiovascular Exercise Tolerance: Good negative cardio ROS Normal cardiovascular exam Rhythm:regular Rate:Normal     Neuro/Psych negative neurological ROS  negative psych ROS   GI/Hepatic negative GI ROS, (+) neg Cirrhosis      , NAFLD   Endo/Other  negative endocrine ROS  Renal/GU negative Renal ROS  negative genitourinary   Musculoskeletal   Abdominal   Peds  Hematology negative hematology ROS (+)   Anesthesia Other Findings Past Medical History: No date: PONV (postoperative nausea and vomiting)   Reproductive/Obstetrics negative OB ROS                             Anesthesia Physical Anesthesia Plan  ASA: 2  Anesthesia Plan: General   Post-op Pain Management:    Induction: Intravenous  PONV Risk Score and Plan: 4 or greater and Ondansetron, Dexamethasone, Midazolam, Aprepitant, Treatment may vary due to age or medical condition and Promethazine  Airway Management Planned: LMA  Additional Equipment:   Intra-op Plan:   Post-operative Plan: Extubation in OR  Informed Consent: I have reviewed the patients History and Physical, chart, labs and discussed the procedure including the risks, benefits and alternatives for the proposed anesthesia with the patient or authorized representative who has indicated his/her understanding and  acceptance.     Dental Advisory Given  Plan Discussed with: Anesthesiologist, CRNA and Surgeon  Anesthesia Plan Comments:         Anesthesia Quick Evaluation

## 2020-08-24 ENCOUNTER — Encounter: Payer: Self-pay | Admitting: Obstetrics and Gynecology

## 2020-08-24 LAB — TYPE AND SCREEN
ABO/RH(D): A POS
Antibody Screen: POSITIVE
Unit division: 0
Unit division: 0

## 2020-08-24 LAB — BPAM RBC
Blood Product Expiration Date: 202208022359
Blood Product Expiration Date: 202208022359
Unit Type and Rh: 6200
Unit Type and Rh: 6200

## 2020-08-28 LAB — SURGICAL PATHOLOGY

## 2020-09-03 ENCOUNTER — Encounter: Payer: Self-pay | Admitting: Obstetrics and Gynecology

## 2020-09-03 ENCOUNTER — Ambulatory Visit (INDEPENDENT_AMBULATORY_CARE_PROVIDER_SITE_OTHER): Payer: 59 | Admitting: Obstetrics and Gynecology

## 2020-09-03 ENCOUNTER — Other Ambulatory Visit: Payer: Self-pay

## 2020-09-03 VITALS — BP 110/70 | Ht 60.0 in | Wt 145.4 lb

## 2020-09-03 DIAGNOSIS — Z9889 Other specified postprocedural states: Secondary | ICD-10-CM

## 2020-09-03 NOTE — Progress Notes (Signed)
  Postoperative Follow-up Patient presents post op from  Hysteroscopy with Dilation and Curettage and IUD removal for  retained IUD and PMB , 1 week ago.  Subjective: Patient reports marked improvement in her preop symptoms. Eating a regular diet without difficulty. The patient is not having any pain.  Activity: normal activities of daily living. Patient reports additional symptom's since surgery of None.  Objective: BP 110/70   Ht 5' (1.524 m)   Wt 145 lb 6.4 oz (66 kg)   BMI 28.40 kg/m  OBGyn Exam  Assessment: s/p :  Hysteroscopy with Dilation and Curettage and IUD removal stable  Plan: Patient has done well after surgery with no apparent complications.  I have discussed the post-operative course to date, and the expected progress moving forward.  The patient understands what complications to be concerned about.  I will see the patient in routine follow up, or sooner if needed.    Activity plan: No restriction.  Adelene Idler MD, [facility], [facility] 09/03/2020 9:00 AM

## 2021-09-10 ENCOUNTER — Ambulatory Visit: Payer: Self-pay | Admitting: Nurse Practitioner

## 2021-09-26 ENCOUNTER — Encounter: Payer: Self-pay | Admitting: Nurse Practitioner

## 2021-09-26 ENCOUNTER — Ambulatory Visit (INDEPENDENT_AMBULATORY_CARE_PROVIDER_SITE_OTHER): Payer: 59 | Admitting: Nurse Practitioner

## 2021-09-26 VITALS — BP 132/70 | HR 84 | Temp 98.0°F | Ht 61.25 in | Wt 150.0 lb

## 2021-09-26 DIAGNOSIS — E663 Overweight: Secondary | ICD-10-CM | POA: Insufficient documentation

## 2021-09-26 DIAGNOSIS — G4700 Insomnia, unspecified: Secondary | ICD-10-CM

## 2021-09-26 DIAGNOSIS — Z1231 Encounter for screening mammogram for malignant neoplasm of breast: Secondary | ICD-10-CM

## 2021-09-26 DIAGNOSIS — Z23 Encounter for immunization: Secondary | ICD-10-CM | POA: Diagnosis not present

## 2021-09-26 DIAGNOSIS — Z0000 Encounter for general adult medical examination without abnormal findings: Secondary | ICD-10-CM

## 2021-09-26 DIAGNOSIS — Z1211 Encounter for screening for malignant neoplasm of colon: Secondary | ICD-10-CM

## 2021-09-26 LAB — COMPREHENSIVE METABOLIC PANEL
ALT: 14 U/L (ref 0–35)
AST: 18 U/L (ref 0–37)
Albumin: 4.5 g/dL (ref 3.5–5.2)
Alkaline Phosphatase: 81 U/L (ref 39–117)
BUN: 11 mg/dL (ref 6–23)
CO2: 26 mEq/L (ref 19–32)
Calcium: 9.6 mg/dL (ref 8.4–10.5)
Chloride: 100 mEq/L (ref 96–112)
Creatinine, Ser: 0.69 mg/dL (ref 0.40–1.20)
GFR: 94.41 mL/min (ref >60.00)
Glucose, Bld: 91 mg/dL (ref 70–99)
Potassium: 4 mEq/L (ref 3.5–5.1)
Sodium: 138 mEq/L (ref 135–145)
Total Bilirubin: 0.7 mg/dL (ref 0.2–1.2)
Total Protein: 8.5 g/dL — ABNORMAL HIGH (ref 6.0–8.3)

## 2021-09-26 LAB — HEMOGLOBIN A1C: Hgb A1c MFr Bld: 5.7 % (ref 4.6–6.5)

## 2021-09-26 LAB — CBC
HCT: 41.4 % (ref 36.0–46.0)
Hemoglobin: 13.8 g/dL (ref 12.0–15.0)
MCHC: 33.3 g/dL (ref 30.0–36.0)
MCV: 86.8 fl (ref 78.0–100.0)
Platelets: 211 10*3/uL (ref 150.0–400.0)
RBC: 4.77 Mil/uL (ref 3.87–5.11)
RDW: 12.9 % (ref 11.5–15.5)
WBC: 5 10*3/uL (ref 4.0–10.5)

## 2021-09-26 LAB — LDL CHOLESTEROL, DIRECT: Direct LDL: 209 mg/dL

## 2021-09-26 LAB — LIPID PANEL
Cholesterol: 322 mg/dL — ABNORMAL HIGH (ref 0–200)
HDL: 47.1 mg/dL (ref >39.00)
NonHDL: 274.68
Total CHOL/HDL Ratio: 7
Triglycerides: 229 mg/dL — ABNORMAL HIGH (ref 0.0–149.0)
VLDL: 45.8 mg/dL — ABNORMAL HIGH (ref 0.0–40.0)

## 2021-09-26 LAB — TSH: TSH: 2.23 u[IU]/mL (ref 0.35–5.50)

## 2021-09-26 MED ORDER — HYDROXYZINE HCL 10 MG PO TABS: 10.0000 mg | ORAL_TABLET | Freq: Every evening | ORAL | 0 refills | Status: DC | PRN

## 2021-09-26 NOTE — Patient Instructions (Addendum)
R?t vui ???c g?p b?n ngy hm nay. Ti s? lin h? v?i k?t qu? phng th nghi?m sau khi ti c chng Ti ? g?i thu?c ??n nh thu?c c?a b?n ?? gip b?n ng?. Hy cho ti bi?t n?u ?i?u ny gip ho?c khng gip Theo di sau 1 n?m v?i ti cho l?n ki?m tra s?c kh?e ti?p theo c?a b?n.  Ti khuyn b?n nn lin h? v?i GYN c?a mnh ?? khm hng n?m

## 2021-09-26 NOTE — Progress Notes (Signed)
New Patient Office Visit  Subjective    Patient ID: Lorraine Roth, female    DOB: 1960/03/04  Age: 61 y.o. MRN: [id number]  CC:  Chief Complaint  Patient presents with   New Patient (Initial Visit)    HPI Lorraine Roth presents to establish care   Insomnia: has trouble getting to sleep. Hard time staying asleep also. States that it is various reasons. Lke bathroom or other reasons.States that she has tried fish oil for the brain health. No benadryl or melatonin.  Room is dark with a night light. States that when the Ac starts running I will wake her up   She tries to go to bed around 10pm and gets up around 530am. She does somewhat rested  for complete physical and follow up of chronic conditions.  Immunizations: -Tetanus: 1996 or 1997. Update today  -Influenza: Out of season -Covid-19: pfizer x 2 -Shingles: information given -Pneumonia: too young.  -HPV: aged out  Diet: Fair diet. States that she eats  2 main meals breakfast and dinner with a fruit snack in the middle. Mainly water. And coffee in the morning  Exercise: Walking hour a day   Eye exam: PRN. Has been years since she has been seen. Dental exam: Needs updating   Pap Smear: Completed in  2022 was abnormal ASCUS.  Recommend follow-up with GYN provider. Mammogram: Completed in 2022 [facility].  Order placed today  Colonoscopy: Never. [place]  Lung Cancer Screening: NA  Dexa: too young   Interpreter: Larena Glassman From [place]     Outpatient Encounter Medications as of 09/26/2021  Medication Sig   Glucosamine HCl (GLUCOSAMINE PO) Take by mouth.   hydrOXYzine (ATARAX) 10 MG tablet Take 1 tablet (10 mg total) by mouth at bedtime as needed.   Omega-3 Fatty Acids (FISH OIL PO) Take 1 capsule by mouth in the morning.   [DISCONTINUED] ibuprofen (ADVIL) 600 MG tablet Take 1 tablet (600 mg total) by mouth every 6 (six) hours as needed.   [DISCONTINUED] Misc Natural Products (COSAMIN ASU FOR JOINT  HEALTH PO) Take 1 tablet by mouth daily as needed (joint pain).   No facility-administered encounter medications on file as of 09/26/2021.    Past Medical History:  Diagnosis Date   PONV (postoperative nausea and vomiting)     Past Surgical History:  Procedure Laterality Date   APPENDECTOMY  1988   carpel tunnel Bilateral    HYSTEROSCOPY WITH D & C N/A 08/23/2020   Procedure: DILATATION AND CURETTAGE /HYSTEROSCOPY;  Surgeon: Natale Milch, MD;  Location: [facility] ORS;  Service: Gynecology;  Laterality: N/A;   IUD REMOVAL N/A 08/23/2020   Procedure: INTRAUTERINE DEVICE (IUD) REMOVAL;  Surgeon: Natale Milch, MD;  Location: [facility] ORS;  Service: Gynecology;  Laterality: N/A;    Family History  Problem Relation Age of Onset   Heart disease Mother    Diabetes Mother     Social History   Socioeconomic History   Marital status: Married    Spouse name: Not on file   Number of children: 2   Years of education: Not on file   Highest education level: Not on file  Occupational History   Not on file  Tobacco Use   Smoking status: Never   Smokeless tobacco: Never  Vaping Use   Vaping Use: Never used  Substance and Sexual Activity   Alcohol use: Never   Drug use: Never   Sexual activity: Not Currently  Other Topics Concern  Not on file  Social History Narrative   Lives with husband       babysit   Social Determinants of Health   Financial Resource Strain: Not on file  Food Insecurity: Not on file  Transportation Needs: Not on file  Physical Activity: Not on file  Stress: Not on file  Social Connections: Not on file  Intimate Partner Violence: Not on file    Review of Systems  Constitutional:  Positive for malaise/fatigue. Negative for chills and fever.  Respiratory:  Negative for shortness of breath.   Cardiovascular:  Negative for chest pain and leg swelling (Varicose veins).  Gastrointestinal:  Negative for nausea and vomiting.       BM daily   Genitourinary:  Negative for dysuria.  Neurological:  Negative for headaches (intermittent).  Psychiatric/Behavioral:  Negative for hallucinations and suicidal ideas.         Objective    BP 132/70 (BP Location: Left Arm, Patient Position: Sitting, Cuff Size: Normal)   Pulse 84   Temp 98 F (36.7 C) (Temporal)   Ht 5' 1.25" (1.556 m)   Wt 150 lb (68 kg)   SpO2 99%   BMI 28.11 kg/m   Physical Exam Nursing note reviewed.  Constitutional:      Appearance: Normal appearance.  HENT:     Right Ear: Tympanic membrane, ear canal and external ear normal.     Left Ear: Tympanic membrane, ear canal and external ear normal.     Mouth/Throat:     Mouth: Mucous membranes are moist.     Pharynx: Oropharynx is clear.  Eyes:     Extraocular Movements: Extraocular movements intact.     Pupils: Pupils are equal, round, and reactive to light.  Cardiovascular:     Rate and Rhythm: Normal rate and regular rhythm.     Pulses: Normal pulses.     Heart sounds: Normal heart sounds.  Pulmonary:     Effort: Pulmonary effort is normal.     Breath sounds: Normal breath sounds.  Abdominal:     General: Bowel sounds are normal.  Musculoskeletal:     Right lower leg: No edema.     Left lower leg: No edema.  Lymphadenopathy:     Cervical: No cervical adenopathy.  Skin:    General: Skin is warm.  Neurological:     General: No focal deficit present.     Mental Status: She is alert.     Deep Tendon Reflexes:     Reflex Scores:      Bicep reflexes are 2+ on the right side and 2+ on the left side.      Patellar reflexes are 2+ on the right side and 2+ on the left side.    Comments: Bilateral upper and lower extremity strength 5/5         Assessment & Plan:   Problem List Items Addressed This Visit       Other   Preventative health care - Primary    Discussed age-appropriate immunizations and screening exams.  Orders placed that were appropriate in office today.      Relevant  Orders   CBC   Comprehensive metabolic panel   Hemoglobin A1c   TSH   Lipid panel   Insomnia    Patient describes difficulty going to sleep and staying asleep.  Will try patient on hydroxyzine 10 mg nightly as needed.  Patient will follow-up or reach out if not effective, not strong enough, or too strong.  Relevant Medications   hydrOXYzine (ATARAX) 10 MG tablet   Other Relevant Orders   TSH   Overweight    Continue working on healthy lifestyle modifications patient does walk 1 hour a week.      Relevant Orders   Hemoglobin A1c   Lipid panel   Other Visit Diagnoses     Need for diphtheria-tetanus-pertussis (Tdap) vaccine       Relevant Orders   Tdap vaccine greater than or equal to 7yo IM (Completed)   Encounter for screening mammogram for malignant neoplasm of breast       Relevant Orders   MM 3D SCREEN BREAST BILATERAL   Screening for colon cancer       Relevant Orders   Ambulatory referral to Gastroenterology       Return in about 1 year (around 09/27/2022) for CPE and Labs .   Audria Nine, NP

## 2021-09-26 NOTE — Assessment & Plan Note (Signed)
Patient describes difficulty going to sleep and staying asleep.  Will try patient on hydroxyzine 10 mg nightly as needed.  Patient will follow-up or reach out if not effective, not strong enough, or too strong.

## 2021-09-26 NOTE — Assessment & Plan Note (Signed)
Continue working on healthy lifestyle modifications patient does walk 1 hour a week.

## 2021-09-26 NOTE — Assessment & Plan Note (Signed)
Discussed age-appropriate immunizations and screening exams.  Orders placed that were appropriate in office today.

## 2021-10-23 ENCOUNTER — Other Ambulatory Visit: Payer: Self-pay | Admitting: Nurse Practitioner

## 2021-10-23 DIAGNOSIS — G4700 Insomnia, unspecified: Secondary | ICD-10-CM

## 2021-11-14 ENCOUNTER — Ambulatory Visit
Admission: RE | Admit: 2021-11-14 | Discharge: 2021-11-14 | Disposition: A | Discharge status: Home / Self Care | Payer: 59 | Source: Ambulatory Visit | Attending: Nurse Practitioner | Admitting: Nurse Practitioner

## 2021-11-14 DIAGNOSIS — Z1231 Encounter for screening mammogram for malignant neoplasm of breast: Secondary | ICD-10-CM | POA: Diagnosis not present

## 2021-11-15 ENCOUNTER — Other Ambulatory Visit: Payer: Self-pay | Admitting: Nurse Practitioner

## 2021-11-15 DIAGNOSIS — R928 Other abnormal and inconclusive findings on diagnostic imaging of breast: Secondary | ICD-10-CM

## 2021-11-15 DIAGNOSIS — N6489 Other specified disorders of breast: Secondary | ICD-10-CM

## 2021-12-17 ENCOUNTER — Ambulatory Visit
Admission: RE | Admit: 2021-12-17 | Discharge: 2021-12-17 | Disposition: A | Discharge status: Home / Self Care | Payer: 59 | Source: Ambulatory Visit | Attending: Nurse Practitioner | Admitting: Nurse Practitioner

## 2021-12-17 DIAGNOSIS — R928 Other abnormal and inconclusive findings on diagnostic imaging of breast: Secondary | ICD-10-CM

## 2021-12-17 DIAGNOSIS — N6489 Other specified disorders of breast: Secondary | ICD-10-CM | POA: Insufficient documentation

## 2021-12-17 DIAGNOSIS — R922 Inconclusive mammogram: Secondary | ICD-10-CM | POA: Diagnosis not present

## 2022-01-15 ENCOUNTER — Other Ambulatory Visit: Payer: Self-pay | Admitting: Nurse Practitioner

## 2022-01-15 DIAGNOSIS — G4700 Insomnia, unspecified: Secondary | ICD-10-CM

## 2022-05-01 ENCOUNTER — Ambulatory Visit: Payer: 59 | Admitting: Nurse Practitioner

## 2022-06-19 ENCOUNTER — Encounter: Payer: 59 | Admitting: Nurse Practitioner

## 2022-09-17 ENCOUNTER — Encounter (INDEPENDENT_AMBULATORY_CARE_PROVIDER_SITE_OTHER): Payer: Self-pay

## 2022-10-01 ENCOUNTER — Ambulatory Visit (INDEPENDENT_AMBULATORY_CARE_PROVIDER_SITE_OTHER): Payer: 59 | Admitting: Nurse Practitioner

## 2022-10-01 ENCOUNTER — Encounter: Payer: Self-pay | Admitting: Nurse Practitioner

## 2022-10-01 VITALS — BP 120/70 | HR 77 | Temp 98.2°F | Ht 61.0 in | Wt 148.4 lb

## 2022-10-01 DIAGNOSIS — E663 Overweight: Secondary | ICD-10-CM | POA: Diagnosis not present

## 2022-10-01 DIAGNOSIS — Z114 Encounter for screening for human immunodeficiency virus [HIV]: Secondary | ICD-10-CM

## 2022-10-01 DIAGNOSIS — Z0000 Encounter for general adult medical examination without abnormal findings: Secondary | ICD-10-CM | POA: Diagnosis not present

## 2022-10-01 DIAGNOSIS — R7303 Prediabetes: Secondary | ICD-10-CM | POA: Diagnosis not present

## 2022-10-01 DIAGNOSIS — Z1159 Encounter for screening for other viral diseases: Secondary | ICD-10-CM | POA: Diagnosis not present

## 2022-10-01 DIAGNOSIS — E782 Mixed hyperlipidemia: Secondary | ICD-10-CM

## 2022-10-01 DIAGNOSIS — Z1231 Encounter for screening mammogram for malignant neoplasm of breast: Secondary | ICD-10-CM | POA: Diagnosis not present

## 2022-10-01 DIAGNOSIS — Z1211 Encounter for screening for malignant neoplasm of colon: Secondary | ICD-10-CM

## 2022-10-01 LAB — CBC
HCT: 40.5 % (ref 36.0–46.0)
Hemoglobin: 13.1 g/dL (ref 12.0–15.0)
MCHC: 32.4 g/dL (ref 30.0–36.0)
MCV: 88.8 fl (ref 78.0–100.0)
Platelets: 263 10*3/uL (ref 150.0–400.0)
RBC: 4.57 Mil/uL (ref 3.87–5.11)
RDW: 12.7 % (ref 11.5–15.5)
WBC: 5.3 10*3/uL (ref 4.0–10.5)

## 2022-10-01 LAB — LIPID PANEL
Cholesterol: 301 mg/dL — ABNORMAL HIGH (ref 0–200)
HDL: 46.8 mg/dL (ref >39.00)
NonHDL: 253.8
Total CHOL/HDL Ratio: 6
Triglycerides: 219 mg/dL — ABNORMAL HIGH (ref 0.0–149.0)
VLDL: 43.8 mg/dL — ABNORMAL HIGH (ref 0.0–40.0)

## 2022-10-01 LAB — COMPREHENSIVE METABOLIC PANEL
ALT: 18 U/L (ref 0–35)
AST: 20 U/L (ref 0–37)
Albumin: 4.3 g/dL (ref 3.5–5.2)
Alkaline Phosphatase: 76 U/L (ref 39–117)
BUN: 16 mg/dL (ref 6–23)
CO2: 29 mEq/L (ref 19–32)
Calcium: 9.7 mg/dL (ref 8.4–10.5)
Chloride: 102 mEq/L (ref 96–112)
Creatinine, Ser: 0.7 mg/dL (ref 0.40–1.20)
GFR: 93.41 mL/min (ref >60.00)
Glucose, Bld: 97 mg/dL (ref 70–99)
Potassium: 4.8 mEq/L (ref 3.5–5.1)
Sodium: 138 mEq/L (ref 135–145)
Total Bilirubin: 0.6 mg/dL (ref 0.2–1.2)
Total Protein: 8.1 g/dL (ref 6.0–8.3)

## 2022-10-01 LAB — TSH: TSH: 1.96 u[IU]/mL (ref 0.35–5.50)

## 2022-10-01 LAB — LDL CHOLESTEROL, DIRECT: Direct LDL: 236 mg/dL

## 2022-10-01 LAB — HEMOGLOBIN A1C: Hgb A1c MFr Bld: 5.6 % (ref 4.6–6.5)

## 2022-10-01 NOTE — Assessment & Plan Note (Signed)
Discussed age-appropriate immunizations and screening exams.  Patient accompanied by her daughter who helped translate.  Did review patient's personal, surgical, social, family histories.  Patient is up-to-date on all age-appropriate vaccinations that she would like.  Ambulatory referral to GI for CRC screening.  Patient is overdue on her cervical cancer screening encouraged follow-up with GYN provider.  Patient up-to-date on breast cancer screening order placed for future purposes.  Patient was given information at discharge about regular healthcare maintenance with anticipatory guidance.

## 2022-10-01 NOTE — Assessment & Plan Note (Addendum)
Pending A1c, lipid, TSH.  Continue working on lifestyle modifications.

## 2022-10-01 NOTE — Assessment & Plan Note (Signed)
Pending lipid panel 

## 2022-10-01 NOTE — Patient Instructions (Signed)
Nice to see you today Make a 2 month nurse visit for the second shingles vaccine Follow up with me in 1 year for the next physical and labs

## 2022-10-01 NOTE — Progress Notes (Signed)
Established Patient Office Visit  Subjective   Patient ID: Lorraine Roth, female    DOB: 1960-03-12  Age: 62 y.o. MRN: [id number]  Chief Complaint  Patient presents with   Annual Exam    No questions or concerns.     HPI  for complete physical and follow up of chronic conditions.  Immunizations: -Tetanus: Completed in 2023 -Influenza: out of season  -Shingles: Completed one injection  -Pneumonia: Too young  Diet: Fair diet. 2 meals a day. Sometimes she will snack. States that she will drink coffee and water Exercise: No regular exercise.  She will walk   Eye exam: over the counter readers as needed eye exam Dental exam: needs updating    Colonoscopy: Referred last year but did not get in touch with the patient  Lung Cancer Screening: NA  Pap smear: 05/30/2020.  Patient did have ASCUS.  Encourage patient to follow-up with GYN provider  Mammogram: 12/17/2021 diagnostic due 10/2022 for screening.  Dexa: Too young  Sleep: states that hse goes to bed around 10 and get up 530. Does feel rested.  Does not snore         Review of Systems  Constitutional:  Negative for chills and fever.  Respiratory:  Negative for shortness of breath.   Cardiovascular:  Negative for chest pain and leg swelling.  Gastrointestinal:  Negative for abdominal pain, blood in stool, constipation, diarrhea, nausea and vomiting.       BM dialy  Genitourinary:  Negative for dysuria and hematuria.  Neurological:  Positive for headaches. Negative for tingling.  Psychiatric/Behavioral:  Negative for hallucinations and suicidal ideas.       Objective:     BP 120/70   Pulse 77   Temp 98.2 F (36.8 C) (Temporal)   Ht 5\' 1"  (1.549 m)   Wt 148 lb 6.4 oz (67.3 kg)   SpO2 98%   BMI 28.04 kg/m  BP Readings from Last 3 Encounters:  10/01/22 120/70  09/26/21 132/70  09/03/20 110/70   Wt Readings from Last 3 Encounters:  10/01/22 148 lb 6.4 oz (67.3 kg)  09/26/21 150 lb (68 kg)  09/03/20 145  lb 6.4 oz (66 kg)      Physical Exam Vitals and nursing note reviewed.  Constitutional:      Appearance: Normal appearance.  HENT:     Right Ear: Tympanic membrane, ear canal and external ear normal.     Left Ear: Tympanic membrane, ear canal and external ear normal.     Mouth/Throat:     Mouth: Mucous membranes are moist.     Pharynx: Oropharynx is clear.  Eyes:     Extraocular Movements: Extraocular movements intact.     Pupils: Pupils are equal, round, and reactive to light.  Cardiovascular:     Rate and Rhythm: Normal rate and regular rhythm.     Pulses: Normal pulses.     Heart sounds: Normal heart sounds.  Pulmonary:     Effort: Pulmonary effort is normal.     Breath sounds: Normal breath sounds.  Abdominal:     General: Bowel sounds are normal. There is no distension.     Palpations: There is no mass.     Tenderness: There is no abdominal tenderness.     Hernia: No hernia is present.  Musculoskeletal:     Right lower leg: No edema.     Left lower leg: No edema.  Lymphadenopathy:     Cervical: No cervical adenopathy.  Skin:  General: Skin is warm.  Neurological:     General: No focal deficit present.     Mental Status: She is alert.     Deep Tendon Reflexes:     Reflex Scores:      Bicep reflexes are 2+ on the right side and 2+ on the left side.      Patellar reflexes are 2+ on the right side and 2+ on the left side.    Comments: Bilateral upper and lower extremity strength 5/5  Psychiatric:        Mood and Affect: Mood normal.        Behavior: Behavior normal.        Thought Content: Thought content normal.        Judgment: Judgment normal.      No results found for any visits on 10/01/22.    The ASCVD Risk score (Arnett DK, et al., 2019) failed to calculate for the following reasons:   The valid total cholesterol range is 130 to 320 mg/dL    Assessment & Plan:   Problem List Items Addressed This Visit       Other   Preventative health care  - Primary    Discussed age-appropriate immunizations and screening exams.  Patient accompanied by her daughter who helped translate.  Did review patient's personal, surgical, social, family histories.  Patient is up-to-date on all age-appropriate vaccinations that she would like.  Ambulatory referral to GI for CRC screening.  Patient is overdue on her cervical cancer screening encouraged follow-up with GYN provider.  Patient up-to-date on breast cancer screening order placed for future purposes.  Patient was given information at discharge about regular healthcare maintenance with anticipatory guidance.      Relevant Orders   CBC   Comprehensive metabolic panel   TSH   Overweight    Pending A1c, lipid, TSH.  Continue working on lifestyle modifications.      Mixed hyperlipidemia    Pending lipid panel      Relevant Orders   Lipid panel   Prediabetes    Pending A1c      Relevant Orders   Hemoglobin A1c   Lipid panel   Other Visit Diagnoses     Encounter for hepatitis C screening test for low risk patient       Relevant Orders   Hepatitis C Antibody   Encounter for screening for HIV       Relevant Orders   HIV antibody (with reflex)   Screening for colon cancer       Relevant Orders   Ambulatory referral to Gastroenterology   Screening mammogram for breast cancer       Relevant Orders   MM 3D SCREENING MAMMOGRAM BILATERAL BREAST       Return in about 1 year (around 10/01/2023) for CPE and Labs.    Audria Nine, NP

## 2022-10-01 NOTE — Assessment & Plan Note (Signed)
Pending A1c

## 2022-10-02 LAB — HIV ANTIBODY (ROUTINE TESTING W REFLEX): HIV 1&2 Ab, 4th Generation: NONREACTIVE

## 2022-10-02 LAB — HEPATITIS C ANTIBODY: Hepatitis C Ab: NONREACTIVE

## 2022-10-06 ENCOUNTER — Telehealth: Payer: Self-pay | Admitting: Nurse Practitioner

## 2022-10-06 DIAGNOSIS — E782 Mixed hyperlipidemia: Secondary | ICD-10-CM

## 2022-10-06 MED ORDER — ROSUVASTATIN CALCIUM 10 MG PO TABS: 10.0000 mg | ORAL_TABLET | Freq: Every day | ORAL | 0 refills | Status: DC

## 2022-10-06 NOTE — Telephone Encounter (Signed)
Medication for crestor 10 mg sent in and order for future labs placed

## 2022-10-06 NOTE — Telephone Encounter (Signed)
-----   Message from [facility] sent at 10/06/2022  4:06 PM EDT ----- Called patient's daughter per DPR. Vi Csau. reviewed all information and repeated back to me. Pt responded Yes to Cholesterol medication. Scheduled pt for lab visit in 3 months. Will call if any questions.

## 2022-10-09 ENCOUNTER — Encounter: Payer: Self-pay | Admitting: *Deleted

## 2022-10-27 ENCOUNTER — Telehealth: Payer: Self-pay

## 2022-10-27 DIAGNOSIS — Z1211 Encounter for screening for malignant neoplasm of colon: Secondary | ICD-10-CM

## 2022-10-27 NOTE — Telephone Encounter (Signed)
Pt requesting call back to schedule colonoscopy.

## 2022-10-28 ENCOUNTER — Other Ambulatory Visit: Payer: Self-pay

## 2022-10-28 DIAGNOSIS — Z1211 Encounter for screening for malignant neoplasm of colon: Secondary | ICD-10-CM

## 2022-10-28 MED ORDER — NA SULFATE-K SULFATE-MG SULF 17.5-3.13-1.6 GM/177ML PO SOLN
1.0000 | Freq: Once | ORAL | 0 refills | Status: AC
Start: 2022-10-28 — End: 2022-10-28

## 2022-10-28 NOTE — Addendum Note (Signed)
Addended by: Avie Arenas on: 10/28/2022 11:05 AM   Modules accepted: Orders

## 2022-10-28 NOTE — Telephone Encounter (Signed)
Gastroenterology Pre-Procedure Review  Request Date: 12/03/22 Requesting Physician: Dr. Allegra Lai  PATIENT REVIEW QUESTIONS: The patient responded to the following health history questions as indicated:    1. Are you having any GI issues? no 2. Do you have a personal history of Polyps? no 3. Do you have a family history of Colon Cancer or Polyps? no 4. Diabetes Mellitus? no 5. Joint replacements in the past 12 months?no 6. Major health problems in the past 3 months?no 7. Any artificial heart valves, MVP, or defibrillator?no    MEDICATIONS & ALLERGIES:    Patient reports the following regarding taking any anticoagulation/antiplatelet therapy:   Plavix, Coumadin, Eliquis, Xarelto, Lovenox, Pradaxa, Brilinta, or Effient? no Aspirin? no  Patient confirms/reports the following medications:  Current Outpatient Medications  Medication Sig Dispense Refill   Na Sulfate-K Sulfate-Mg Sulf 17.5-3.13-1.6 GM/177ML SOLN Take 1 kit by mouth once for 1 dose. 354 mL 0   Glucosamine HCl (GLUCOSAMINE PO) Take by mouth.     hydrOXYzine (ATARAX) 10 MG tablet TAKE 1 TABLET BY MOUTH EVERY DAY AT BEDTIME AS NEEDED 90 tablet 1   Omega-3 Fatty Acids (FISH OIL PO) Take 1 capsule by mouth in the morning.     rosuvastatin (CRESTOR) 10 MG tablet Take 1 tablet (10 mg total) by mouth daily. 90 tablet 0   No current facility-administered medications for this visit.    Patient confirms/reports the following allergies:  No Known Allergies  No orders of the defined types were placed in this encounter.   AUTHORIZATION INFORMATION Primary Insurance: 1D#: Group #:  Secondary Insurance: 1D#: Group #:  SCHEDULE INFORMATION: Date: 12/03/22 Time: Location: [facility]

## 2022-10-28 NOTE — Telephone Encounter (Signed)
Attempted to contact patient with the assistance of [employer] yesterday and this morning to schedule her colonoscopy.  [employer] asked if patient spoke an alternative language.  All of the [place] interpreters were helping other callers.  I waited for ten minutes.  I will call patient without the assistance of [employer].  Daughter Vi speaks English and was able to assist in scheduling.  Daughter is on the [place].

## 2022-11-17 ENCOUNTER — Ambulatory Visit
Admission: RE | Admit: 2022-11-17 | Discharge: 2022-11-17 | Disposition: A | Discharge status: Home / Self Care | Payer: 59 | Source: Ambulatory Visit | Attending: Nurse Practitioner | Admitting: Nurse Practitioner

## 2022-11-17 DIAGNOSIS — Z1231 Encounter for screening mammogram for malignant neoplasm of breast: Secondary | ICD-10-CM | POA: Diagnosis not present

## 2022-12-03 ENCOUNTER — Encounter
Admission: RE | Disposition: A | Discharge status: Home / Self Care | Payer: Self-pay | Source: Home / Self Care | Attending: Gastroenterology

## 2022-12-03 ENCOUNTER — Ambulatory Visit: Payer: 59 | Admitting: Certified Registered"

## 2022-12-03 ENCOUNTER — Ambulatory Visit
Admission: RE | Admit: 2022-12-03 | Discharge: 2022-12-03 | Disposition: A | Discharge status: Home / Self Care | Payer: 59 | Attending: Gastroenterology | Admitting: Gastroenterology

## 2022-12-03 ENCOUNTER — Encounter: Payer: Self-pay | Admitting: Gastroenterology

## 2022-12-03 DIAGNOSIS — Z1211 Encounter for screening for malignant neoplasm of colon: Secondary | ICD-10-CM

## 2022-12-03 DIAGNOSIS — D123 Benign neoplasm of transverse colon: Secondary | ICD-10-CM | POA: Insufficient documentation

## 2022-12-03 DIAGNOSIS — K635 Polyp of colon: Secondary | ICD-10-CM

## 2022-12-03 DIAGNOSIS — K5730 Diverticulosis of large intestine without perforation or abscess without bleeding: Secondary | ICD-10-CM | POA: Insufficient documentation

## 2022-12-03 HISTORY — PX: COLONOSCOPY WITH PROPOFOL: SHX5780

## 2022-12-03 SURGERY — COLONOSCOPY WITH PROPOFOL
Anesthesia: General

## 2022-12-03 MED ORDER — PROPOFOL 500 MG/50ML IV EMUL
INTRAVENOUS | Status: DC | PRN
  Administered 2022-12-03: 120 ug/kg/min via INTRAVENOUS

## 2022-12-03 MED ORDER — LIDOCAINE HCL (CARDIAC) PF 100 MG/5ML IV SOSY
PREFILLED_SYRINGE | INTRAVENOUS | Status: DC | PRN
  Administered 2022-12-03: 50 mg via INTRAVENOUS

## 2022-12-03 MED ORDER — PROPOFOL 10 MG/ML IV BOLUS
INTRAVENOUS | Status: DC | PRN
  Administered 2022-12-03: 60 mg via INTRAVENOUS

## 2022-12-03 MED ORDER — STERILE WATER FOR IRRIGATION IR SOLN
Status: DC | PRN
  Administered 2022-12-03: 180 mL

## 2022-12-03 MED ORDER — SODIUM CHLORIDE 0.9 % IV SOLN: INTRAVENOUS | Status: DC

## 2022-12-03 NOTE — Anesthesia Postprocedure Evaluation (Signed)
Anesthesia Post Note  Patient: Lorraine Roth  Procedure(s) Performed: COLONOSCOPY WITH PROPOFOL  Patient location during evaluation: Endoscopy Anesthesia Type: General Level of consciousness: awake and alert Pain management: pain level controlled Vital Signs Assessment: post-procedure vital signs reviewed and stable Respiratory status: spontaneous breathing, nonlabored ventilation, respiratory function stable and patient connected to nasal cannula oxygen Cardiovascular status: blood pressure returned to baseline and stable Postop Assessment: no apparent nausea or vomiting Anesthetic complications: no  No notable events documented.   Last Vitals:  Vitals:   12/03/22 1003 12/03/22 1013  BP: 95/64 114/71  Pulse: (!) 58   Resp: 15   Temp: (!) 36.3 C   SpO2: 98%     Last Pain:  Vitals:   12/03/22 1023  TempSrc:   PainSc: 0-No pain                 Stephanie Coup

## 2022-12-03 NOTE — Transfer of Care (Signed)
Immediate Anesthesia Transfer of Care Note  Patient: Lorraine Roth  Procedure(s) Performed: COLONOSCOPY WITH PROPOFOL  Patient Location: Endoscopy Unit  Anesthesia Type:General  Level of Consciousness: drowsy  Airway & Oxygen Therapy: Patient Spontanous Breathing  Post-op Assessment: Report given to RN  Post vital signs: stable  Last Vitals:  Vitals Value Taken Time  BP    Temp    Pulse    Resp    SpO2      Last Pain:  Vitals:   12/03/22 0921  TempSrc: Temporal         Complications: No notable events documented.

## 2022-12-03 NOTE — H&P (Signed)
Arlyss Repress, MD [address]  [address]  [place], [place] [address]  Main: [phone]  Fax: [phone] Pager: [phone]  Primary Care Physician:  Eden Emms, NP Primary Gastroenterologist:  Dr. Arlyss Repress  Pre-Procedure History & Physical: HPI:  Lorraine Roth is a 62 y.o. female is here for an colonoscopy.   Past Medical History:  Diagnosis Date   Hyperlipidemia    PONV (postoperative nausea and vomiting)     Past Surgical History:  Procedure Laterality Date   APPENDECTOMY  1988   carpel tunnel Bilateral    HYSTEROSCOPY WITH D & C N/A 08/23/2020   Procedure: DILATATION AND CURETTAGE /HYSTEROSCOPY;  Surgeon: Natale Milch, MD;  Location: [facility] ORS;  Service: Gynecology;  Laterality: N/A;   IUD REMOVAL N/A 08/23/2020   Procedure: INTRAUTERINE DEVICE (IUD) REMOVAL;  Surgeon: Natale Milch, MD;  Location: [facility] ORS;  Service: Gynecology;  Laterality: N/A;    Prior to Admission medications   Medication Sig Start Date End Date Taking? Authorizing Provider  rosuvastatin (CRESTOR) 10 MG tablet Take 1 tablet (10 mg total) by mouth daily. 10/06/22  Yes Eden Emms, NP  Glucosamine HCl (GLUCOSAMINE PO) Take by mouth.    [provider]  hydrOXYzine (ATARAX) 10 MG tablet TAKE 1 TABLET BY MOUTH EVERY DAY AT BEDTIME AS NEEDED 01/16/22   Eden Emms, NP  Omega-3 Fatty Acids (FISH OIL PO) Take 1 capsule by mouth in the morning.    [provider]    Allergies as of 10/28/2022   (No Known Allergies)    Family History  Problem Relation Age of Onset   Heart disease Mother    Diabetes Mother     Social History   Socioeconomic History   Marital status: Married    Spouse name: Not on file   Number of children: 2   Years of education: Not on file   Highest education level: Not on file  Occupational History   Not on file  Tobacco Use   Smoking status: Never   Smokeless tobacco: Never  Vaping Use   Vaping status: Never Used   Substance and Sexual Activity   Alcohol use: Never   Drug use: Never   Sexual activity: Not Currently  Other Topics Concern   Not on file  Social History Narrative   Lives with husband       babysit   Social Determinants of Health   Financial Resource Strain: Not on file  Food Insecurity: Not on file  Transportation Needs: Not on file  Physical Activity: Not on file  Stress: Not on file  Social Connections: Not on file  Intimate Partner Violence: Not on file    Review of Systems: See HPI, otherwise negative ROS  Physical Exam: BP (!) 151/69   Pulse 72   Temp (!) 97.1 F (36.2 C) (Temporal)   Resp 16   Ht 5' 1.81" (1.57 m)   Wt 66.8 kg   SpO2 100%   BMI 27.09 kg/m  General:   Alert,  pleasant and cooperative in NAD Head:  Normocephalic and atraumatic. Neck:  Supple; no masses or thyromegaly. Lungs:  Clear throughout to auscultation.    Heart:  Regular rate and rhythm. Abdomen:  Soft, nontender and nondistended. Normal bowel sounds, without guarding, and without rebound.   Neurologic:  Alert and  oriented x4;  grossly normal neurologically.  Impression/Plan: Lorraine Roth is here for an colonoscopy to be performed for colon cancer screening  Risks, benefits, limitations, and alternatives regarding  colonoscopy have been reviewed with the patient.  Questions have been answered.  All parties agreeable.   Lannette Donath, MD  12/03/2022, 9:46 AM

## 2022-12-03 NOTE — Anesthesia Preprocedure Evaluation (Signed)
Anesthesia Evaluation  Patient identified by MRN, date of birth, ID band Patient awake    Reviewed: Allergy & Precautions, NPO status , Patient's Chart, lab work & pertinent test results  History of Anesthesia Complications (+) PONV and history of anesthetic complications  Airway Mallampati: III  TM Distance: >3 FB Neck ROM: full    Dental  (+) Chipped, Dental Advidsory Given   Pulmonary neg pulmonary ROS   Pulmonary exam normal        Cardiovascular negative cardio ROS Normal cardiovascular exam     Neuro/Psych negative neurological ROS  negative psych ROS   GI/Hepatic negative GI ROS, Neg liver ROS,,,  Endo/Other  negative endocrine ROS    Renal/GU negative Renal ROS  negative genitourinary   Musculoskeletal   Abdominal   Peds  Hematology negative hematology ROS (+)   Anesthesia Other Findings Past Medical History: No date: Hyperlipidemia No date: PONV (postoperative nausea and vomiting)  Past Surgical History: 1988: APPENDECTOMY No date: carpel tunnel; Bilateral 08/23/2020: HYSTEROSCOPY WITH D & C; N/A     Comment:  Procedure: DILATATION AND CURETTAGE /HYSTEROSCOPY;                Surgeon: Natale Milch, MD;  Location: [facility] ORS;               Service: Gynecology;  Laterality: N/A; 08/23/2020: IUD REMOVAL; N/A     Comment:  Procedure: INTRAUTERINE DEVICE (IUD) REMOVAL;  Surgeon:               Natale Milch, MD;  Location: [facility] ORS;  Service:              Gynecology;  Laterality: N/A;     Reproductive/Obstetrics negative OB ROS                             Anesthesia Physical Anesthesia Plan  ASA: 2  Anesthesia Plan: General   Post-op Pain Management:    Induction: Intravenous  PONV Risk Score and Plan: Ondansetron, Propofol infusion and TIVA  Airway Management Planned: Natural Airway and Nasal Cannula  Additional Equipment:   Intra-op Plan:    Post-operative Plan:   Informed Consent: I have reviewed the patients History and Physical, chart, labs and discussed the procedure including the risks, benefits and alternatives for the proposed anesthesia with the patient or authorized representative who has indicated his/her understanding and acceptance.     Dental Advisory Given  Plan Discussed with: Anesthesiologist, CRNA and Surgeon  Anesthesia Plan Comments: (Patient consented for risks of anesthesia including but not limited to:  - adverse reactions to medications - risk of airway placement if required - damage to eyes, teeth, lips or other oral mucosa - nerve damage due to positioning  - sore throat or hoarseness - Damage to heart, brain, nerves, lungs, other parts of body or loss of life  Patient voiced understanding and assent.)       Anesthesia Quick Evaluation

## 2022-12-03 NOTE — Op Note (Signed)
[facility] Gastroenterology Patient Name: [name] Procedure Date: 12/03/2022 9:39 AM MRN: [id number] Account #: [id number] Date of Birth: 12/04/1960 Admit Type: Outpatient Age: 62 Room: [facility] ENDO ROOM 1 Gender: Female Note Status: Finalized Instrument Name: Peds Colonoscope [id number] Procedure:             Colonoscopy Indications:           Screening for colorectal malignant neoplasm, This is                         the patient's first colonoscopy Providers:             Toney Reil MD, MD Referring MD:          Toney Reil MD, MD (Referring MD), Genene Churn.                         Toney Reil (Referring MD) Medicines:             General Anesthesia Complications:         No immediate complications. Estimated blood loss: None. Procedure:             Pre-Anesthesia Assessment:                        - Prior to the procedure, a History and Physical was                         performed, and patient medications and allergies were                         reviewed. The patient is competent. The risks and                         benefits of the procedure and the sedation options and                         risks were discussed with the patient. All questions                         were answered and informed consent was obtained.                         Patient identification and proposed procedure were                         verified by the physician, the nurse, the                         anesthesiologist, the anesthetist and the technician                         in the pre-procedure area in the procedure room in the                         endoscopy suite. Mental Status Examination: alert and                         oriented. Airway Examination: normal oropharyngeal  airway and neck mobility. Respiratory Examination:                         clear to auscultation. CV Examination: normal.                         Prophylactic Antibiotics:  The patient does not require                         prophylactic antibiotics. Prior Anticoagulants: The                         patient has taken no anticoagulant or antiplatelet                         agents. ASA Grade Assessment: II - A patient with mild                         systemic disease. After reviewing the risks and                         benefits, the patient was deemed in satisfactory                         condition to undergo the procedure. The anesthesia                         plan was to use general anesthesia. Immediately prior                         to administration of medications, the patient was                         re-assessed for adequacy to receive sedatives. The                         heart rate, respiratory rate, oxygen saturations,                         blood pressure, adequacy of pulmonary ventilation, and                         response to care were monitored throughout the                         procedure. The physical status of the patient was                         re-assessed after the procedure.                        After obtaining informed consent, the colonoscope was                         passed under direct vision. Throughout the procedure,                         the patient's blood pressure, pulse, and oxygen  saturations were monitored continuously. The                         Colonoscope was introduced through the anus and                         advanced to the the cecum, identified by appendiceal                         orifice and ileocecal valve. The colonoscopy was                         performed without difficulty. The patient tolerated                         the procedure well. The quality of the bowel                         preparation was evaluated using the BBPS [facility] Bowel                         Preparation Scale) with scores of: Right Colon = 2                         (minor amount of  residual staining, small fragments of                         stool and/or opaque liquid, but mucosa seen well),                         Transverse Colon = 3 (entire mucosa seen well with no                         residual staining, small fragments of stool or opaque                         liquid) and Left Colon = 3 (entire mucosa seen well                         with no residual staining, small fragments of stool or                         opaque liquid). The total BBPS score equals 8. The                         ileocecal valve, appendiceal orifice, and rectum were                         photographed. Findings:      The perianal and digital rectal examinations were normal. Pertinent       negatives include normal sphincter tone and no palpable rectal lesions.      A 3 mm polyp was found in the transverse colon. The polyp was sessile.       The polyp was removed with a jumbo cold forceps. Resection and retrieval       were complete.      The retroflexed view of the distal  rectum and anal verge was normal and       showed no anal or rectal abnormalities. Impression:            - One 3 mm polyp in the transverse colon, removed with                         a jumbo cold forceps. Resected and retrieved.                        - The distal rectum and anal verge are normal on                         retroflexion view. Recommendation:        - Discharge patient to home (with escort).                        - Resume previous diet today.                        - Continue present medications.                        - Await pathology results.                        - Repeat colonoscopy in 3 years with 2 day prep                         because the bowel preparation was suboptimal. Procedure Code(s):     --- Professional ---                        [phone], Colonoscopy, flexible; with biopsy, single or                         multiple Diagnosis Code(s):     --- Professional ---                         Z12.11, Encounter for screening for malignant neoplasm                         of colon                        D12.3, Benign neoplasm of transverse colon (hepatic                         flexure or splenic flexure) CPT copyright 2022 American Medical Association. All rights reserved. The codes documented in this report are preliminary and upon coder review may  be revised to meet current compliance requirements. Dr. Libby Maw Toney Reil MD, MD 12/03/2022 10:05:13 AM This report has been signed electronically. Number of Addenda: 0 Note Initiated On: 12/03/2022 9:39 AM Scope Withdrawal Time: 0 hours 7 minutes 11 seconds  Total Procedure Duration: 0 hours 10 minutes 42 seconds  Estimated Blood Loss:  Estimated blood loss: none.      [facility]

## 2022-12-04 ENCOUNTER — Encounter: Payer: Self-pay | Admitting: Gastroenterology

## 2022-12-04 LAB — SURGICAL PATHOLOGY

## 2022-12-08 ENCOUNTER — Encounter: Payer: Self-pay | Admitting: Gastroenterology

## 2022-12-10 NOTE — Plan of Care (Signed)
[facility] Tonsillectomy/Adenoidectomy, Postoperative PEDS care plan entered in error.

## 2023-01-06 ENCOUNTER — Other Ambulatory Visit (INDEPENDENT_AMBULATORY_CARE_PROVIDER_SITE_OTHER): Payer: 59

## 2023-01-06 DIAGNOSIS — E782 Mixed hyperlipidemia: Secondary | ICD-10-CM

## 2023-01-06 LAB — LIPID PANEL
Cholesterol: 226 mg/dL — ABNORMAL HIGH (ref 0–200)
HDL: 46.5 mg/dL (ref >39.00)
LDL Cholesterol: 150 mg/dL — ABNORMAL HIGH (ref 0–99)
NonHDL: 179.21
Total CHOL/HDL Ratio: 5
Triglycerides: 148 mg/dL (ref 0.0–149.0)
VLDL: 29.6 mg/dL (ref 0.0–40.0)

## 2023-01-06 LAB — HEPATIC FUNCTION PANEL
ALT: 19 U/L (ref 0–35)
AST: 24 U/L (ref 0–37)
Albumin: 4.2 g/dL (ref 3.5–5.2)
Alkaline Phosphatase: 83 U/L (ref 39–117)
Bilirubin, Direct: 0.1 mg/dL (ref 0.0–0.3)
Total Bilirubin: 0.8 mg/dL (ref 0.2–1.2)
Total Protein: 8.1 g/dL (ref 6.0–8.3)

## 2023-01-08 ENCOUNTER — Other Ambulatory Visit: Payer: Self-pay | Admitting: Nurse Practitioner

## 2023-01-08 DIAGNOSIS — E782 Mixed hyperlipidemia: Secondary | ICD-10-CM

## 2023-01-08 MED ORDER — ROSUVASTATIN CALCIUM 10 MG PO TABS: 10.0000 mg | ORAL_TABLET | Freq: Every day | ORAL | 2 refills | Status: AC

## 2023-04-25 IMAGING — MG MM DIGITAL SCREENING BILAT W/ TOMO AND CAD
6 of 12 series · 6 of 36 positions shown · non-contrast
Comparison: None.

CLINICAL DATA: Screening.

EXAM:
DIGITAL SCREENING BILATERAL MAMMOGRAM WITH TOMOSYNTHESIS AND CAD
TECHNIQUE: Bilateral screening digital craniocaudal and mediolateral oblique
mammograms were obtained. Bilateral screening digital breast
tomosynthesis was performed. The images were evaluated with
computer-aided detection.

[R CC synth-2D (1 of 2)]
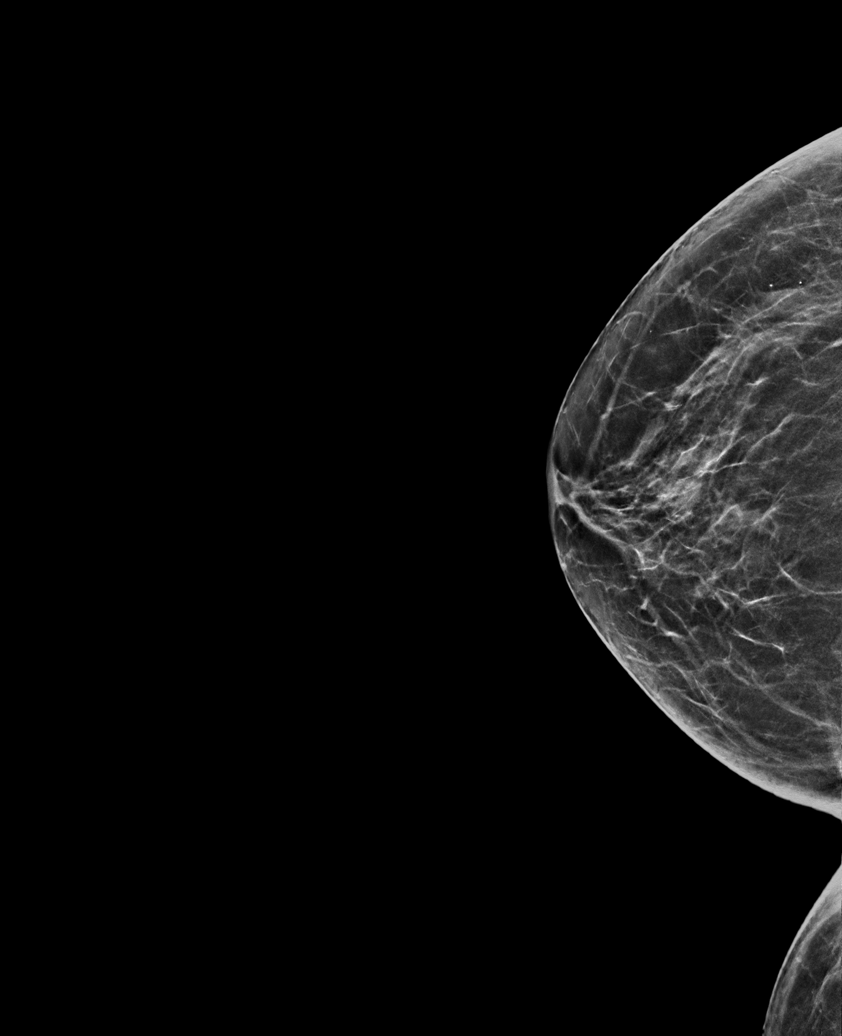

[R MLO synth-2D (1 of 2)]
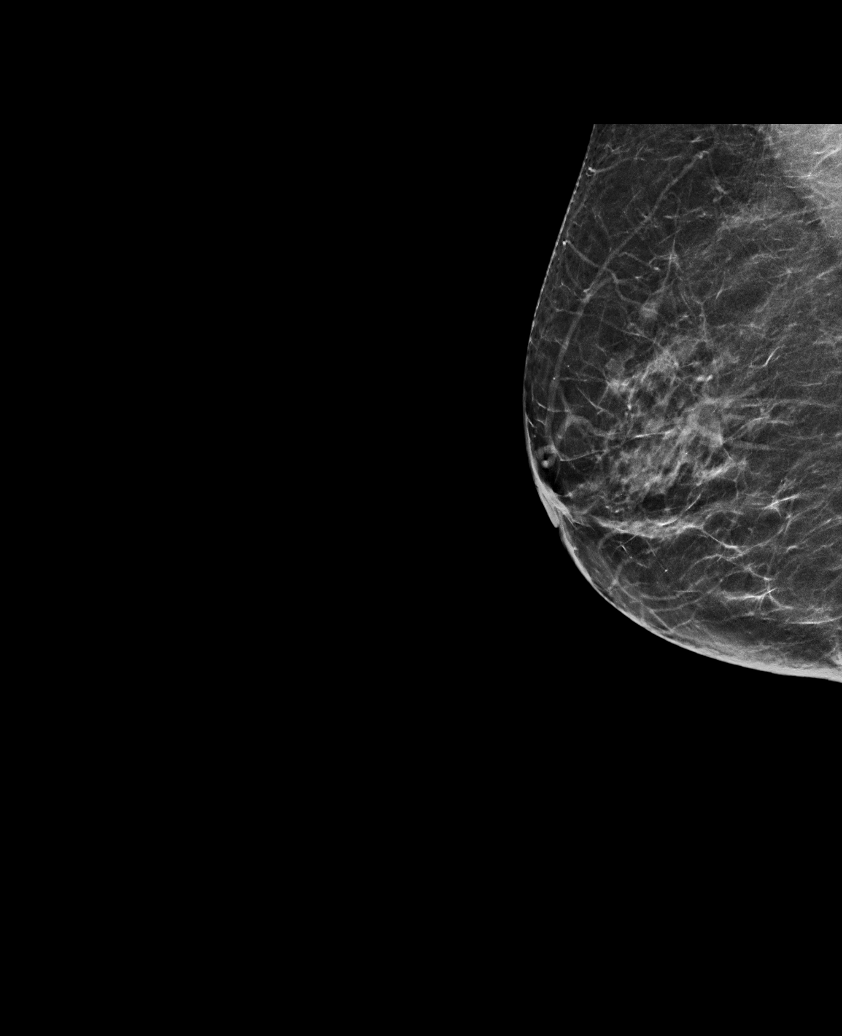

[L CC synth-2D]
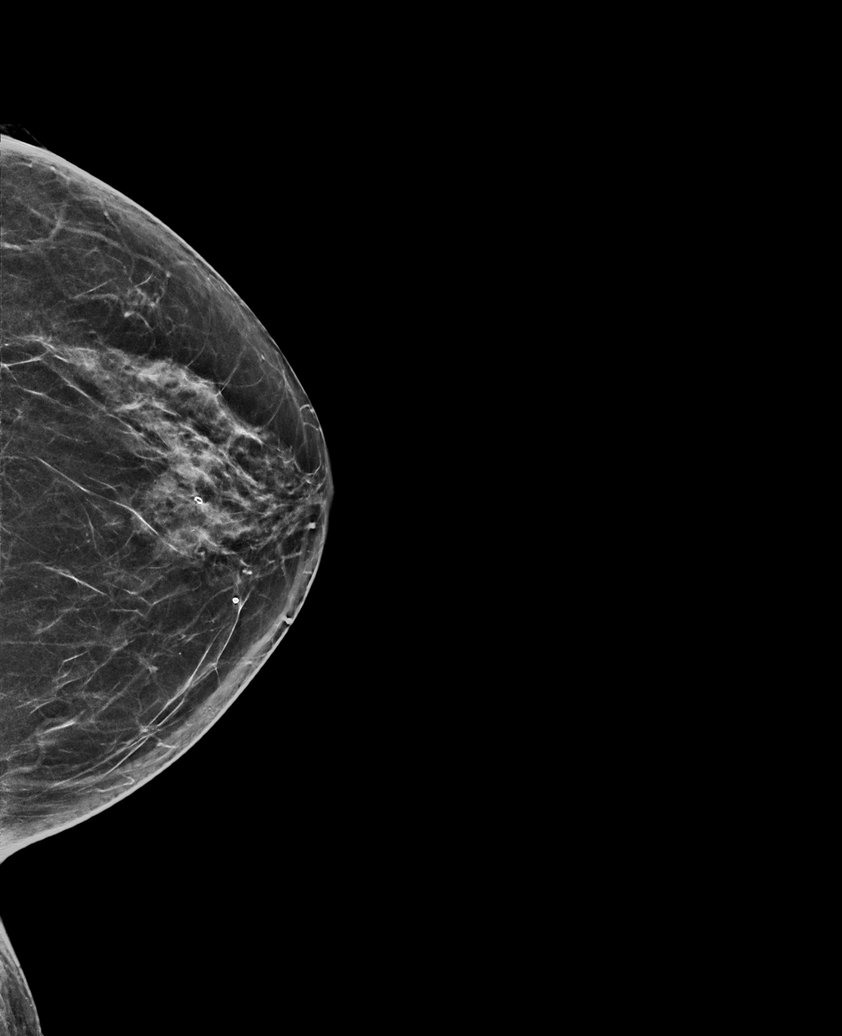

[R MLO synth-2D (2 of 2)]
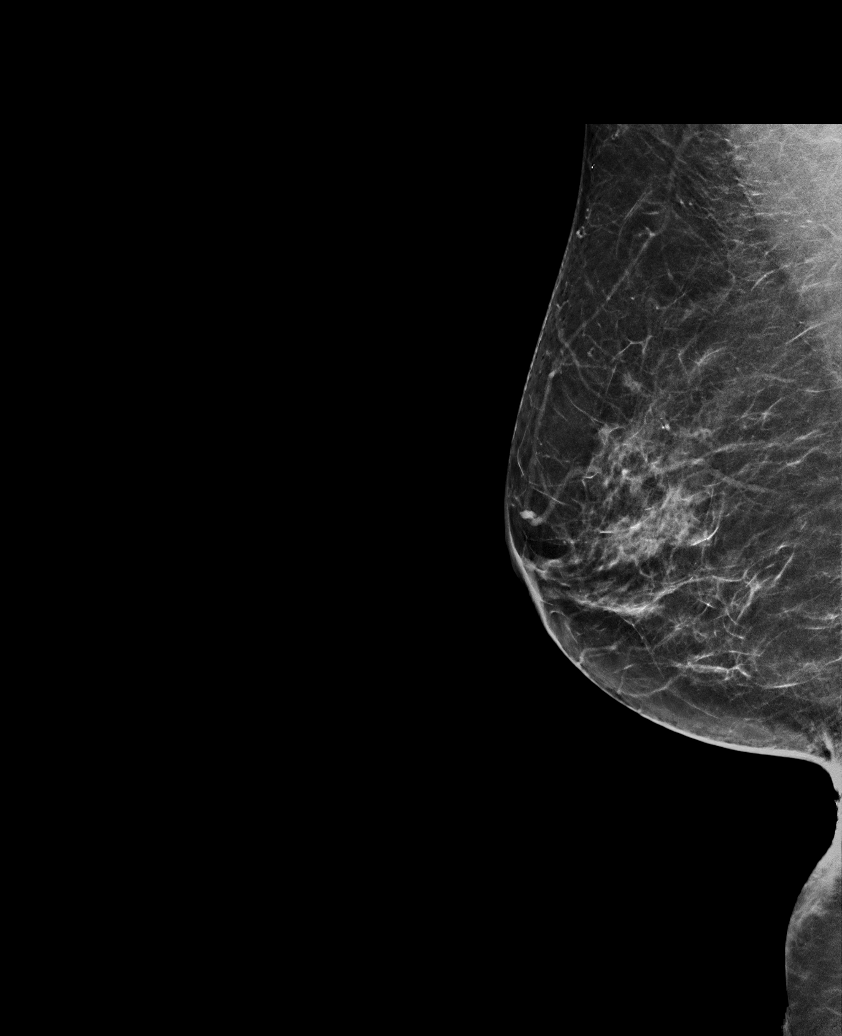

[R CC synth-2D (2 of 2)]
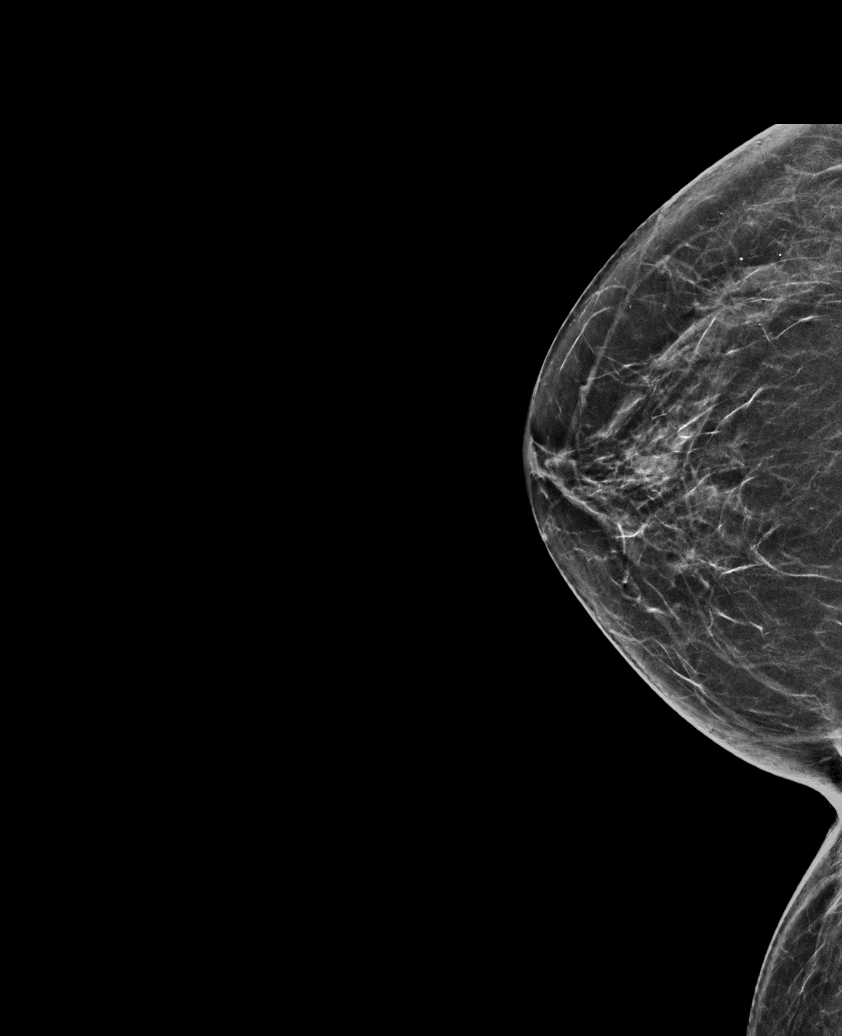

[L MLO synth-2D]
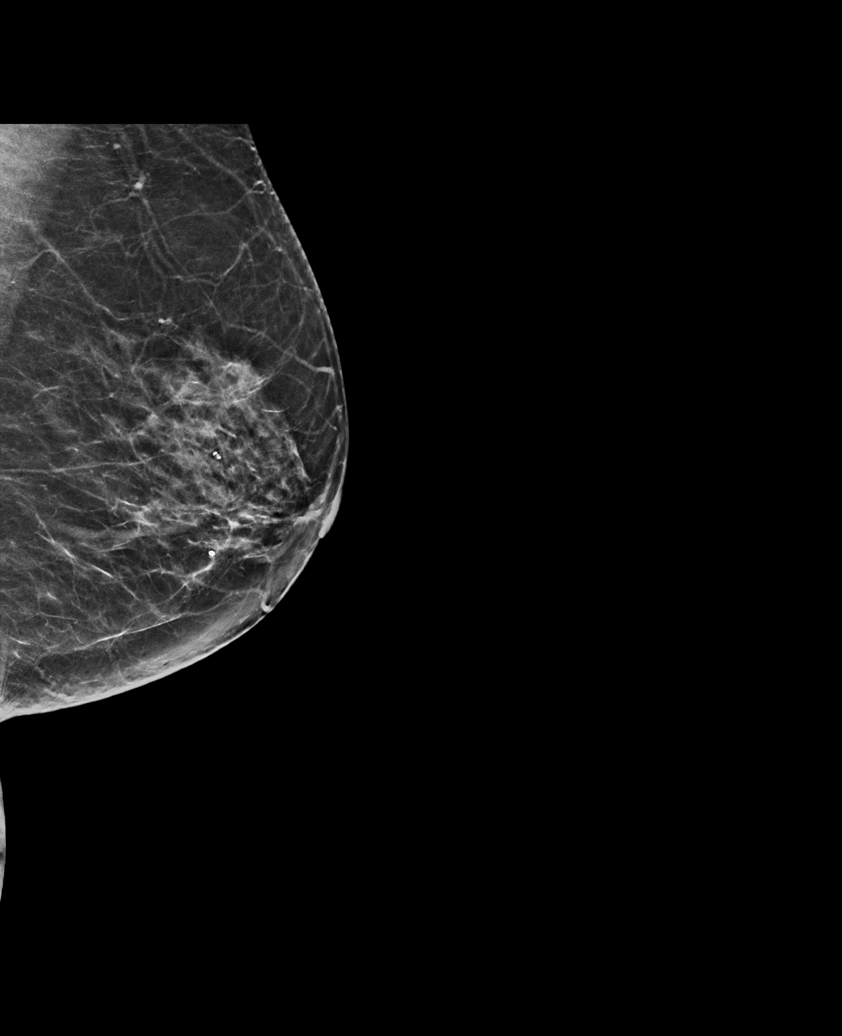

[6 of 36 positions shown; findings below may reference images not displayed]

ACR Breast Density Category b: There are scattered areas of
fibroglandular density.
FINDINGS: There are no findings suspicious for malignancy.
IMPRESSION: No mammographic evidence of malignancy. A result letter of this
screening mammogram will be mailed directly to the patient.

RECOMMENDATION:
Screening mammogram in one year. (Code:XG-X-X7B)

BI-RADS CATEGORY  1: Negative.
